# Patient Record
Sex: Male | Born: 1955 | Race: White | Hispanic: No | Smoking: Current every day smoker
Health system: Southern US, Community
[De-identification: ages and names within clinical notes are randomized; demographics above are authoritative.]

## PROBLEM LIST (undated history)

## (undated) DIAGNOSIS — R972 Elevated prostate specific antigen [PSA]: Secondary | ICD-10-CM

## (undated) DIAGNOSIS — E785 Hyperlipidemia, unspecified: Secondary | ICD-10-CM

## (undated) DIAGNOSIS — K219 Gastro-esophageal reflux disease without esophagitis: Secondary | ICD-10-CM

## (undated) DIAGNOSIS — J342 Deviated nasal septum: Secondary | ICD-10-CM

## (undated) DIAGNOSIS — R7301 Impaired fasting glucose: Secondary | ICD-10-CM

## (undated) DIAGNOSIS — F419 Anxiety disorder, unspecified: Secondary | ICD-10-CM

## (undated) DIAGNOSIS — F329 Major depressive disorder, single episode, unspecified: Secondary | ICD-10-CM

## (undated) DIAGNOSIS — G4730 Sleep apnea, unspecified: Secondary | ICD-10-CM

## (undated) DIAGNOSIS — I82409 Acute embolism and thrombosis of unspecified deep veins of unspecified lower extremity: Secondary | ICD-10-CM

## (undated) DIAGNOSIS — N400 Enlarged prostate without lower urinary tract symptoms: Secondary | ICD-10-CM

## (undated) DIAGNOSIS — F32A Depression, unspecified: Secondary | ICD-10-CM

## (undated) DIAGNOSIS — I10 Essential (primary) hypertension: Secondary | ICD-10-CM

## (undated) DIAGNOSIS — N486 Induration penis plastica: Secondary | ICD-10-CM

## (undated) DIAGNOSIS — J309 Allergic rhinitis, unspecified: Secondary | ICD-10-CM

## (undated) DIAGNOSIS — C801 Malignant (primary) neoplasm, unspecified: Secondary | ICD-10-CM

## (undated) HISTORY — PX: TONSILLECTOMY: SUR1361

## (undated) HISTORY — DX: Elevated prostate specific antigen (PSA): R97.20

## (undated) HISTORY — DX: Major depressive disorder, single episode, unspecified: F32.9

## (undated) HISTORY — DX: Essential (primary) hypertension: I10

## (undated) HISTORY — DX: Acute embolism and thrombosis of unspecified deep veins of unspecified lower extremity: I82.409

## (undated) HISTORY — DX: Sleep apnea, unspecified: G47.30

## (undated) HISTORY — DX: Allergic rhinitis, unspecified: J30.9

## (undated) HISTORY — PX: OTHER SURGICAL HISTORY: SHX169

## (undated) HISTORY — DX: Depression, unspecified: F32.A

## (undated) HISTORY — DX: Benign prostatic hyperplasia without lower urinary tract symptoms: N40.0

## (undated) HISTORY — DX: Hyperlipidemia, unspecified: E78.5

## (undated) HISTORY — DX: Anxiety disorder, unspecified: F41.9

## (undated) HISTORY — DX: Gastro-esophageal reflux disease without esophagitis: K21.9

## (undated) HISTORY — DX: Induration penis plastica: N48.6

---

## 2006-10-15 ENCOUNTER — Ambulatory Visit: Payer: Self-pay | Admitting: Family Medicine

## 2008-11-15 ENCOUNTER — Ambulatory Visit: Payer: Self-pay | Admitting: Family Medicine

## 2009-08-23 ENCOUNTER — Ambulatory Visit: Payer: Self-pay | Admitting: Otolaryngology

## 2009-09-04 ENCOUNTER — Ambulatory Visit: Payer: Self-pay | Admitting: Otolaryngology

## 2010-12-10 ENCOUNTER — Ambulatory Visit: Payer: Self-pay | Admitting: Internal Medicine

## 2011-07-18 ENCOUNTER — Ambulatory Visit: Payer: Self-pay | Admitting: Family Medicine

## 2014-08-10 ENCOUNTER — Ambulatory Visit: Payer: Self-pay | Admitting: Family Medicine

## 2014-08-10 DIAGNOSIS — F32A Depression, unspecified: Secondary | ICD-10-CM | POA: Insufficient documentation

## 2014-08-10 DIAGNOSIS — F329 Major depressive disorder, single episode, unspecified: Secondary | ICD-10-CM | POA: Insufficient documentation

## 2014-08-10 DIAGNOSIS — R7303 Prediabetes: Secondary | ICD-10-CM | POA: Insufficient documentation

## 2014-08-10 DIAGNOSIS — G4730 Sleep apnea, unspecified: Secondary | ICD-10-CM | POA: Insufficient documentation

## 2014-08-10 DIAGNOSIS — I82409 Acute embolism and thrombosis of unspecified deep veins of unspecified lower extremity: Secondary | ICD-10-CM | POA: Insufficient documentation

## 2014-08-10 DIAGNOSIS — F419 Anxiety disorder, unspecified: Secondary | ICD-10-CM

## 2014-08-10 DIAGNOSIS — K219 Gastro-esophageal reflux disease without esophagitis: Secondary | ICD-10-CM | POA: Insufficient documentation

## 2015-01-29 ENCOUNTER — Ambulatory Visit: Payer: Self-pay | Admitting: Family Medicine

## 2015-03-22 DIAGNOSIS — J301 Allergic rhinitis due to pollen: Secondary | ICD-10-CM | POA: Insufficient documentation

## 2015-03-22 DIAGNOSIS — E782 Mixed hyperlipidemia: Secondary | ICD-10-CM | POA: Insufficient documentation

## 2015-03-22 DIAGNOSIS — I10 Essential (primary) hypertension: Secondary | ICD-10-CM | POA: Insufficient documentation

## 2015-07-12 ENCOUNTER — Ambulatory Visit: Payer: Self-pay | Admitting: Urology

## 2015-07-19 ENCOUNTER — Other Ambulatory Visit: Payer: Self-pay | Admitting: *Deleted

## 2015-07-19 ENCOUNTER — Encounter: Payer: Self-pay | Admitting: *Deleted

## 2015-07-31 ENCOUNTER — Ambulatory Visit (INDEPENDENT_AMBULATORY_CARE_PROVIDER_SITE_OTHER): Payer: 59 | Admitting: Urology

## 2015-07-31 ENCOUNTER — Encounter: Payer: Self-pay | Admitting: Urology

## 2015-07-31 VITALS — BP 149/90 | HR 73 | Ht 75.0 in | Wt 225.7 lb

## 2015-07-31 DIAGNOSIS — N401 Enlarged prostate with lower urinary tract symptoms: Secondary | ICD-10-CM

## 2015-07-31 DIAGNOSIS — N486 Induration penis plastica: Secondary | ICD-10-CM

## 2015-07-31 DIAGNOSIS — R972 Elevated prostate specific antigen [PSA]: Secondary | ICD-10-CM

## 2015-07-31 DIAGNOSIS — N138 Other obstructive and reflux uropathy: Secondary | ICD-10-CM

## 2015-07-31 LAB — BLADDER SCAN AMB NON-IMAGING: SCAN RESULT: 21

## 2015-07-31 NOTE — Progress Notes (Signed)
07/31/2015 9:16 AM   Derrick Reyes. Mar 16, 1956 [id number]  Referring provider: No referring provider defined for this encounter.  Chief Complaint  Patient presents with  . Elevated PSA    6 month recheck  . Benign Prostatic Hypertrophy    HPI: Mr. Derrick Reyes is a 59 year old white male with elevated PSA, Peyronie's disease and BPH with L UTS who presents today for 6 month follow-up.  Elevated PSA Patient underwent a prostate biopsy on 09/02/2011 for a PSA of 5.8 ng/mL and on 03/22/2013 for a PSA of 5.7 ng/mL and both were negative.  Patient also had a PCA 3 test performed on 11/30/2013 with the results returning at 55, which is a negative result. His most current PSA is 5.9 ng/mL on 01/13/2015.   Peyronie's disease: Patient complains of a mild dorsal curvature and impaired sensation. It is not interfering with excellent activity and he denies painful erections.  BPH WITH LUTS: His IPSS score today is 4, which is mild lower urinary tract symptomatology. He is mostly satisfied with his quality life due to his urinary symptoms. His PVR is 21 mL.    He has no major urinary complaints today.  He denies any dysuria, hematuria or suprapubic pain.   He has had a prostate biopsy on 09/02/2011 which was negative for malignancy.  Previous PSA's:       12/04/2010- 3.04 ng/mL       06/14/2011- 12.29 ng/mL        07/26/2011- 5.8 ng/mL        09/02/2011- 5.8 ng/mL; biopsy negative        12/21/2011- 6.2 ng/mL        02/24/2013- 5.7 ng/mL        03/22/2013-biopsy negative        10/01/2013- 7.6 ng/mL        11/30/2013- PCA3 negative        07/13/2014- 5.3 ng/mL        5.9 ng/mL on 01/13/2015  He also denies any recent fevers, chills, nausea or vomiting.  He does not have a family history of PCa.      IPSS      07/31/15 0800       International Prostate Symptom Score   How often have you had the sensation of not emptying your bladder? Not at All     How often have you  had to urinate less than every two hours? Not at All     How often have you found you stopped and started again several times when you urinated? Not at All     How often have you found it difficult to postpone urination? Not at All     How often have you had a weak urinary stream? About half the time     How often have you had to strain to start urination? Not at All     How many times did you typically get up at night to urinate? 1 Time     Total IPSS Score 4     Quality of Life due to urinary symptoms   If you were to spend the rest of your life with your urinary condition just the way it is now how would you feel about that? Mostly Satisfied        Score:  1-7 Mild 8-19 Moderate 20-35 Severe   PMH: Past Medical History  Diagnosis Date  . Sleep apnea   . HTN (hypertension)   . Anxiety and depression   .  DVT (deep venous thrombosis)   . HLD (hyperlipidemia)   . GERD (gastroesophageal reflux disease)   . Allergic rhinitis   . Elevated PSA   . Peyronie's disease   . BPH (benign prostatic hyperplasia)     Surgical History: Past Surgical History  Procedure Laterality Date  . Tonsillectomy      Home Medications:    Medication List       This list is accurate as of: 07/31/15  9:16 AM.  Always use your most recent med list.               amLODipine 10 MG tablet  Commonly known as:  NORVASC  Take 10 mg by mouth daily.     atorvastatin 10 MG tablet  Commonly known as:  LIPITOR  TAKE 1 TABLET (10 MG TOTAL) BY MOUTH ONCE DAILY.     CENTRUM SPECIALIST HEART Tabs     Cinnamon 500 MG capsule     RA KRILL OIL 500 MG Caps     sertraline 100 MG tablet  Commonly known as:  ZOLOFT  Take 100 mg by mouth daily.     triamcinolone 55 MCG/ACT Aero nasal inhaler  Commonly known as:  NASACORT  Place into the nose.     XARELTO 20 MG Tabs tablet  Generic drug:  rivaroxaban  TAKE 1 TABLET (20 MG TOTAL) BY MOUTH DAILY WITH DINNER.        Allergies:  Allergies    Allergen Reactions  . Lisinopril   . Losartan     Family History: Family History  Problem Relation Age of Onset  . Lung cancer Mother   . Prostate cancer Neg Hx   . Kidney disease Neg Hx     Social History:  reports that he has been smoking.  He does not have any smokeless tobacco history on file. He reports that he drinks alcohol. He reports that he does not use illicit drugs.  ROS: UROLOGY Frequent Urination?: No Hard to postpone urination?: No Burning/pain with urination?: No Get up at night to urinate?: No Leakage of urine?: No Urine stream starts and stops?: No Trouble starting stream?: No Do you have to strain to urinate?: No Blood in urine?: No Urinary tract infection?: No Sexually transmitted disease?: No Injury to kidneys or bladder?: No Painful intercourse?: No Weak stream?: Yes Erection problems?: Yes Penile pain?: No  Gastrointestinal Nausea?: No Vomiting?: No Indigestion/heartburn?: No Diarrhea?: No Constipation?: No  Constitutional Fever: No Night sweats?: No Weight loss?: No Fatigue?: No  Skin Skin rash/lesions?: No Itching?: No  Eyes Blurred vision?: No Double vision?: No  Ears/Nose/Throat Sore throat?: No Sinus problems?: Yes  Hematologic/Lymphatic Swollen glands?: No Easy bruising?: Yes  Cardiovascular Leg swelling?: No Chest pain?: No  Respiratory Cough?: No Shortness of breath?: No  Endocrine Excessive thirst?: No  Musculoskeletal Back pain?: Yes Joint pain?: No  Neurological Headaches?: No Dizziness?: No  Psychologic Depression?: Yes Anxiety?: Yes  Physical Exam: BP 149/90 mmHg  Pulse 73  Ht 6\' 3"  (1.905 m)  Wt 225 lb 11.2 oz (102.377 kg)  BMI 28.21 kg/m2  GU: Patient with circumcised phallus.  Urethral meatus is patent.  No penile discharge. No penile lesions or rashes. Scrotum without lesions, cysts, rashes and/or edema.  Testicles are located scrotally bilaterally. No masses are appreciated in the  testicles. Left and right epididymis are normal. Rectal: Patient with  normal sphincter tone. Perineum without scarring or rashes. No rectal masses are appreciated. Prostate is approximately 60 grams,  no nodules are appreciated. Seminal vesicles are normal.   Laboratory Data: Results for orders placed or performed in visit on 07/31/15  PSA  Result Value Ref Range   Prostate Specific Ag, Serum 7.2 (H) 0.0 - 4.0 ng/mL   Pertinent Imaging: BLADDER SCAN AMB NON-IMAGING  Result Value Ref Range   Scan Result 21     Assessment & Plan:    1. Elevated PSA:   Patient with a history of elevated PSA and 2 negative biopsies and a negative PCA 3. We will continue to monitor every 6 months with DRE's, IPSS scores and PSA's.   If patient should develop an abnormal DRE or a rapid rise in PSA, we will consider re-biopsying.  - PSA - BLADDER SCAN AMB NON-IMAGING  2. Peyronie's disease:   Patient's disease is mild at this time. We will continue to monitor. We will obtain a SHIM score in 6 months.  3. BPH with LUTS:   I PSS score is 4/2. His PVR is 21 mL. We will continue to monitor the patient. He will follow-up in 6 months time for a I PSS score and PVR.  Nursing note for RTC:      - IPSS score      - PVR      -SHIM score      -PSA (should have been drawn prior to appointment)   Return in about 6 months (around 01/31/2016) for IPSS and PVR.  Zara Council, [facility] [address], [place],  [address] [phone]

## 2015-08-01 LAB — PSA: PROSTATE SPECIFIC AG, SERUM: 7.2 ng/mL — AB (ref 0.0–4.0)

## 2015-08-06 DIAGNOSIS — R972 Elevated prostate specific antigen [PSA]: Secondary | ICD-10-CM | POA: Insufficient documentation

## 2015-08-06 DIAGNOSIS — N486 Induration penis plastica: Secondary | ICD-10-CM | POA: Insufficient documentation

## 2015-08-06 DIAGNOSIS — N401 Enlarged prostate with lower urinary tract symptoms: Secondary | ICD-10-CM

## 2015-08-06 DIAGNOSIS — N138 Other obstructive and reflux uropathy: Secondary | ICD-10-CM | POA: Insufficient documentation

## 2016-01-23 ENCOUNTER — Other Ambulatory Visit: Payer: Self-pay

## 2016-01-23 DIAGNOSIS — R972 Elevated prostate specific antigen [PSA]: Secondary | ICD-10-CM

## 2016-01-24 ENCOUNTER — Other Ambulatory Visit: Payer: 59

## 2016-01-24 DIAGNOSIS — R972 Elevated prostate specific antigen [PSA]: Secondary | ICD-10-CM

## 2016-01-25 LAB — PSA: PROSTATE SPECIFIC AG, SERUM: 10.7 ng/mL — AB (ref 0.0–4.0)

## 2016-01-31 ENCOUNTER — Ambulatory Visit (INDEPENDENT_AMBULATORY_CARE_PROVIDER_SITE_OTHER): Payer: 59 | Admitting: Urology

## 2016-01-31 ENCOUNTER — Encounter: Payer: Self-pay | Admitting: Urology

## 2016-01-31 VITALS — BP 153/90 | HR 65 | Ht 75.0 in | Wt 234.9 lb

## 2016-01-31 DIAGNOSIS — N486 Induration penis plastica: Secondary | ICD-10-CM

## 2016-01-31 DIAGNOSIS — N528 Other male erectile dysfunction: Secondary | ICD-10-CM | POA: Diagnosis not present

## 2016-01-31 DIAGNOSIS — N529 Male erectile dysfunction, unspecified: Secondary | ICD-10-CM

## 2016-01-31 DIAGNOSIS — N401 Enlarged prostate with lower urinary tract symptoms: Secondary | ICD-10-CM

## 2016-01-31 DIAGNOSIS — R972 Elevated prostate specific antigen [PSA]: Secondary | ICD-10-CM | POA: Diagnosis not present

## 2016-01-31 DIAGNOSIS — N138 Other obstructive and reflux uropathy: Secondary | ICD-10-CM

## 2016-01-31 MED ORDER — PENTOXIFYLLINE ER 400 MG PO TBCR: EXTENDED_RELEASE_TABLET | ORAL | Status: DC

## 2016-01-31 NOTE — Progress Notes (Signed)
9:19 AM   Derrick Reyes 1956/08/17 [id number]  Referring provider: Sofie Hartigan, MD [place], [place] [address]  Chief Complaint  Patient presents with  . Elevated PSA    6 month follow up     HPI: Mr. Derrick Reyes is a 60 year old white male with elevated PSA, Peyronie's disease and BPH with LUTS who presents today for 6 month follow-up.  Elevated PSA Patient underwent a prostate biopsy on 09/02/2011 for a PSA of 5.8 ng/mL and on 03/22/2013 for a PSA of 5.7 ng/mL and both were negative.  Patient also had a PCA 3 test performed on 11/30/2013 with the results returning at 76, which is a negative result. His most current PSA is 10.7 ng/mL on 01/24/2016.   Peyronie's disease: Patient complains of a mild dorsal curvature and impaired sensation. It is now interfering with sexual activity.  It is not getting firm enough for intercourse.  He denies painful erections.  Erectile dysfunction His SHIM score is 12, which is mild to moderate ED.   He has been having difficulty with erections the several months.   His major complaint is the lack of firmness for successful intercourse.  His libido is preserved.   His risk factors for ED are BPH, smoking, HTN, HLD, Peyronie's disease and sleep apnea.  He denies any painful erections or curvatures with his erections.        SHIM      01/31/16 0840       SHIM: Over the last 6 months:   How do you rate your confidence that you could get and keep an erection? Moderate     When you had erections with sexual stimulation, how often were your erections hard enough for penetration (entering your partner)? A Few Times (much less than half the time)     During sexual intercourse, how often were you able to maintain your erection after you had penetrated (entered) your partner? Very Difficult     During sexual intercourse, how difficult was it to maintain your erection to completion of  intercourse? Difficult     When you attempted sexual intercourse, how often was it satisfactory for you? Very Difficult     SHIM Total Score   SHIM 12        Score: 1-7 Severe ED 8-11 Moderate ED 12-16 Mild-Moderate ED 17-21 Mild ED 22-25 No ED   BPH WITH LUTS: His IPSS score today is 3, which is mild lower urinary tract symptomatology. He is mostly satisfied with his quality life due to his urinary symptoms.  He has no major urinary complaints today.  He denies any dysuria, hematuria or suprapubic pain.   He has had a prostate biopsy on 09/02/2011 which was negative for malignancy.  He also denies any recent fevers, chills, nausea or vomiting.  He does not have a family history of PCa.      IPSS      01/31/16 0800       International Prostate Symptom Score   How often have you had the sensation of not emptying your bladder? Not at All     How often have you had to urinate less than every two hours? Not at All     How often have you found you stopped and started again several times when you urinated? Less than 1 in 5 times     How often have you found it difficult to postpone  urination? Not at All     How often have you had a weak urinary stream? Less than 1 in 5 times     How often have you had to strain to start urination? Not at All     How many times did you typically get up at night to urinate? 1 Time     Total IPSS Score 3     Quality of Life due to urinary symptoms   If you were to spend the rest of your life with your urinary condition just the way it is now how would you feel about that? Mostly Satisfied        Score:  1-7 Mild 8-19 Moderate 20-35 Severe   PMH: Past Medical History  Diagnosis Date  . Sleep apnea   . HTN (hypertension)   . Anxiety and depression   . DVT (deep venous thrombosis) ([place])   . HLD (hyperlipidemia)   . GERD (gastroesophageal reflux disease)   . Allergic rhinitis   . Elevated PSA   . Peyronie's disease   . BPH (benign prostatic  hyperplasia)   . HLD (hyperlipidemia)     Surgical History: Past Surgical History  Procedure Laterality Date  . Tonsillectomy      Home Medications:    Medication List       This list is accurate as of: 01/31/16  9:19 AM.  Always use your most recent med list.               amLODipine 10 MG tablet  Commonly known as:  NORVASC  Take 10 mg by mouth daily.     atorvastatin 10 MG tablet  Commonly known as:  LIPITOR  TAKE 1 TABLET (10 MG TOTAL) BY MOUTH ONCE DAILY.     CENTRUM SPECIALIST HEART Tabs  Reported on 01/31/2016     Cinnamon 500 MG capsule     pentoxifylline 400 MG CR tablet  Commonly known as:  TRENTAL  Take twice daily     RA KRILL OIL 500 MG Caps     sertraline 100 MG tablet  Commonly known as:  ZOLOFT  Take 100 mg by mouth daily.     triamcinolone 55 MCG/ACT Aero nasal inhaler  Commonly known as:  NASACORT  Place into the nose.     XARELTO 20 MG Tabs tablet  Generic drug:  rivaroxaban  TAKE 1 TABLET (20 MG TOTAL) BY MOUTH DAILY WITH DINNER.        Allergies:  Allergies  Allergen Reactions  . Lisinopril   . Losartan     Family History: Family History  Problem Relation Age of Onset  . Lung cancer Mother   . Prostate cancer Neg Hx   . Kidney disease Neg Hx     Social History:  reports that he has been smoking.  He does not have any smokeless tobacco history on file. He reports that he drinks alcohol. He reports that he does not use illicit drugs.  ROS: UROLOGY Frequent Urination?: No Hard to postpone urination?: No Burning/pain with urination?: No Get up at night to urinate?: No Leakage of urine?: No Urine stream starts and stops?: No Trouble starting stream?: No Do you have to strain to urinate?: No Blood in urine?: No Urinary tract infection?: No Sexually transmitted disease?: No Injury to kidneys or bladder?: No Painful intercourse?: No Weak stream?: No Erection problems?: Yes Penile pain?:  No  Gastrointestinal Nausea?: No Vomiting?: No Indigestion/heartburn?: No Diarrhea?: No Constipation?: No  Constitutional  Fever: No Night sweats?: No Weight loss?: No Fatigue?: No  Skin Skin rash/lesions?: No Itching?: No  Eyes Blurred vision?: No Double vision?: No  Ears/Nose/Throat Sore throat?: No Sinus problems?: No  Hematologic/Lymphatic Swollen glands?: No Easy bruising?: No  Cardiovascular Leg swelling?: No Chest pain?: No  Respiratory Cough?: No Shortness of breath?: No  Endocrine Excessive thirst?: No  Musculoskeletal Back pain?: No Joint pain?: No  Neurological Headaches?: No Dizziness?: No  Psychologic Depression?: No Anxiety?: No  Physical Exam: BP 153/90 mmHg  Pulse 65  Ht 6\' 3"  (1.905 m)  Wt 234 lb 14.4 oz (106.55 kg)  BMI 29.36 kg/m2  GU: Patient with circumcised phallus.  Urethral meatus is patent.  No penile discharge. No penile lesions or rashes. Scrotum without lesions, cysts, rashes and/or edema.  Testicles are located scrotally bilaterally. No masses are appreciated in the testicles. Left and right epididymis are normal. Rectal: Patient with  normal sphincter tone. Perineum without scarring or rashes. No rectal masses are appreciated. Prostate is approximately 60 grams, no nodules are appreciated. Seminal vesicles are normal.   Laboratory Data: Previous PSA's:       12/04/2010- 3.04 ng/mL       06/14/2011- 12.29 ng/mL        07/26/2011- 5.8 ng/mL        09/02/2011- 5.8 ng/mL; biopsy negative        12/21/2011- 6.2 ng/mL        02/24/2013- 5.7 ng/mL        03/22/2013-biopsy negative        10/01/2013- 7.6 ng/mL        11/30/2013- PCA3 negative        07/13/2014- 5.3 ng/mL        5.9 ng/mL on 01/13/2015 Results for orders placed or performed in visit on 07/31/15  PSA  Result Value Ref Range   Prostate Specific Ag, Serum 7.2 (H) 0.0 - 4.0 ng/mL   10.7 ng/mL  On 01/23/2015  Assessment & Plan:    1. Elevated PSA:    Patient with a history of elevated PSA and 2 negative biopsies and a negative PCA 3.  His most recent PSA was 10.7 from 01/24/2016.  I have repeated his PSA today.  If it returns elevated, I will obtain an MRI.  Patient is vehemently against a biopsy.  If it returns to baseline, we will continue to monitor and he will RTC in 6 months for IPSS score, exam and PSA.    - PSA  2. Peyronie's disease:   Patient's disease has worsened at this time.  I will start Trental 400 mg bid.  He will return in 6 months for a SHIM score and exam.  3. BPH with LUTS:   IPSS score is 3/2.  His repeated PSA is still pending.  Follow up will be based on those results.    4. Erectile dysfunction:   SHIM score is 12.  We discussed trying a different PDE5 inhibitor, intra-urethral suppositories, intra cavernous vasoactive drug injection therapy, vacuum constriction device and penile prosthesis implantation.  He would like to try Viagra.  He is advised to take it two hours on a empty stomach before intercourse.  He is warned not to take the Viagra with medications that contain nitrates.  I also advised him of the side effects, such as: headache, flushing, dyspepsia, abnormal vision, nasal congestion, back pain, myalgia, nausea, dizziness, and rash.  Return in about 6 months (around 07/30/2016) for IPSS score, SHIM score and exam.  Zara Council, [facility] [address] [place], [place] [address] [phone]

## 2016-02-01 ENCOUNTER — Other Ambulatory Visit: Payer: Self-pay | Admitting: Urology

## 2016-02-01 ENCOUNTER — Telehealth: Payer: Self-pay

## 2016-02-01 DIAGNOSIS — R972 Elevated prostate specific antigen [PSA]: Secondary | ICD-10-CM

## 2016-02-01 LAB — PSA: PROSTATE SPECIFIC AG, SERUM: 9.2 ng/mL — AB (ref 0.0–4.0)

## 2016-02-01 NOTE — Telephone Encounter (Signed)
It's already schd for 02-23-16 at [facility]

## 2016-02-01 NOTE — Telephone Encounter (Signed)
-----   Message from Nori Riis, PA-C sent at 02/01/2016  8:51 AM EST ----- Patient does not want to have another biopsy. He will agree to a MRI of the prostate. This needs to be scheduled at [facility]. I will put in the order

## 2016-02-22 ENCOUNTER — Other Ambulatory Visit: Payer: Self-pay

## 2016-02-22 ENCOUNTER — Telehealth: Payer: Self-pay | Admitting: Urology

## 2016-02-22 NOTE — Telephone Encounter (Signed)
Pt called and would like to get some type of sedative before MRI tomorrow.  He has to be at [facility] at [facility].  L[place] ordered MRI.  [phone] office Pt said it was O.K. To leave message on office line or cell#.  Please advise.

## 2016-02-23 ENCOUNTER — Ambulatory Visit (HOSPITAL_COMMUNITY)
Admission: RE | Admit: 2016-02-23 | Discharge: 2016-02-23 | Disposition: A | Discharge status: Home / Self Care | Payer: 59 | Source: Ambulatory Visit | Attending: Urology | Admitting: Urology

## 2016-02-23 DIAGNOSIS — R938 Abnormal findings on diagnostic imaging of other specified body structures: Secondary | ICD-10-CM | POA: Diagnosis not present

## 2016-02-23 DIAGNOSIS — R972 Elevated prostate specific antigen [PSA]: Secondary | ICD-10-CM | POA: Insufficient documentation

## 2016-02-23 LAB — POCT I-STAT CREATININE: CREATININE: 0.9 mg/dL (ref 0.61–1.24)

## 2016-02-23 MED ORDER — GADOBENATE DIMEGLUMINE 529 MG/ML IV SOLN
20.0000 mL | Freq: Once | INTRAVENOUS | Status: AC | PRN
  Administered 2016-02-23: 20 mL via INTRAVENOUS

## 2016-02-23 NOTE — Telephone Encounter (Signed)
Derrick Reyes was able to handle the situation.

## 2016-02-26 ENCOUNTER — Other Ambulatory Visit: Payer: Self-pay | Admitting: Urology

## 2016-02-26 MED ORDER — DIAZEPAM 10 MG PO TABS: ORAL_TABLET | ORAL | Status: DC

## 2016-03-14 ENCOUNTER — Ambulatory Visit (INDEPENDENT_AMBULATORY_CARE_PROVIDER_SITE_OTHER): Payer: 59 | Admitting: Urology

## 2016-03-14 ENCOUNTER — Encounter: Payer: Self-pay | Admitting: Urology

## 2016-03-14 VITALS — BP 131/84 | HR 72 | Ht 75.0 in | Wt 231.2 lb

## 2016-03-14 DIAGNOSIS — N529 Male erectile dysfunction, unspecified: Secondary | ICD-10-CM | POA: Diagnosis not present

## 2016-03-14 DIAGNOSIS — N486 Induration penis plastica: Secondary | ICD-10-CM

## 2016-03-14 DIAGNOSIS — N400 Enlarged prostate without lower urinary tract symptoms: Secondary | ICD-10-CM | POA: Diagnosis not present

## 2016-03-14 DIAGNOSIS — R972 Elevated prostate specific antigen [PSA]: Secondary | ICD-10-CM

## 2016-03-14 MED ORDER — LEVOFLOXACIN 500 MG PO TABS
500.0000 mg | ORAL_TABLET | Freq: Once | ORAL | Status: AC
Start: 2016-03-14 — End: 2016-03-14
  Administered 2016-03-14: 500 mg via ORAL

## 2016-03-14 MED ORDER — GENTAMICIN SULFATE 40 MG/ML IJ SOLN
80.0000 mg | Freq: Once | INTRAMUSCULAR | Status: AC
Start: 2016-03-14 — End: 2016-03-14
  Administered 2016-03-14: 80 mg via INTRAMUSCULAR

## 2016-03-14 NOTE — Progress Notes (Signed)
03/14/2016 2:08 PM   Derrick Reyes 01-24-1956 [id number]  Referring provider: Sofie Hartigan, MD [place],  [address]  Chief Complaint  Patient presents with  . Biopsy    prostate biopsy    HPI: Mr. Derrick Reyes is a 60 year old white male with elevated PSA, Peyronie's disease and BPH with LUTS who presents today for discussion of MRI results  Elevated PSA Patient underwent a prostate biopsy on 09/02/2011 for a PSA of 5.8 ng/mL and on 03/22/2013 for a PSA of 5.7 ng/mL and both were negative. Patient also had a PCA 3 test performed on 11/30/2013 with the results returning at 25, which is a negative result. His most current PSA is 10.7 ng/mL on 01/24/2016.  He underwent a diffusion-weighted MRI of prostate which showed 2 PIRADS-4 lesions. He had a 1-1/2 cm lesion in the left lateral mid gland. He also had a 1.7 cm lesion in the anterior aspect of the right mid gland near the transition zone.  Peyronie's disease: Patient complains of a mild dorsal curvature and impaired sensation. It is now interfering with sexual activity. It is not getting firm enough for intercourse. He denies painful erections.  Erectile dysfunction His SHIM score is 12, which is mild to moderate ED. He has been having difficulty with erections the several months. His major complaint is the lack of firmness for successful intercourse. His libido is preserved. His risk factors for ED are BPH, smoking, HTN, HLD, Peyronie's disease and sleep apnea. He denies any painful erections or curvatures with his erections.     PMH: Past Medical History  Diagnosis Date  . Sleep apnea   . HTN (hypertension)   . Anxiety and depression   . DVT (deep venous thrombosis) ([place])   . HLD (hyperlipidemia)   . GERD (gastroesophageal reflux disease)   . Allergic rhinitis   . Elevated PSA   . Peyronie's disease   . BPH (benign prostatic hyperplasia)   .  HLD (hyperlipidemia)     Surgical History: Past Surgical History  Procedure Laterality Date  . Tonsillectomy      Home Medications:    Medication List       This list is accurate as of: 03/14/16  2:08 PM.  Always use your most recent med list.               amLODipine 10 MG tablet  Commonly known as:  NORVASC  Take 10 mg by mouth daily.     atorvastatin 10 MG tablet  Commonly known as:  LIPITOR  TAKE 1 TABLET (10 MG TOTAL) BY MOUTH ONCE DAILY.     Cinnamon 500 MG capsule     diazepam 10 MG tablet  Commonly known as:  VALIUM  Take one tablet 30 minutes prior to your biopsy.     pentoxifylline 400 MG CR tablet  Commonly known as:  TRENTAL  Take twice daily     RA KRILL OIL 500 MG Caps     sertraline 100 MG tablet  Commonly known as:  ZOLOFT  Take 100 mg by mouth daily.     triamcinolone 55 MCG/ACT Aero nasal inhaler  Commonly known as:  NASACORT  Place into the nose.     XARELTO 20 MG Tabs tablet  Generic drug:  rivaroxaban  TAKE 1 TABLET (20 MG TOTAL) BY MOUTH DAILY WITH DINNER.        Allergies:  Allergies  Allergen Reactions  .  Lisinopril   . Losartan     Family History: Family History  Problem Relation Age of Onset  . Lung cancer Mother   . Prostate cancer Neg Hx   . Kidney disease Neg Hx     Social History:  reports that he has been smoking Cigarettes.  He has been smoking about 1.00 pack per day. He does not have any smokeless tobacco history on file. He reports that he drinks alcohol. He reports that he does not use illicit drugs.  ROS:                                        Physical Exam: BP 131/84 mmHg  Pulse 72  Ht 6\' 3"  (1.905 m)  Wt 231 lb 3.2 oz (104.872 kg)  BMI 28.90 kg/m2  Constitutional:  Alert and oriented, No acute distress. HEENT: [place] AT, moist mucus membranes.  Trachea midline, no masses. Cardiovascular: No clubbing, cyanosis, or edema. Respiratory: Normal respiratory effort, no increased work  of breathing. GI: Abdomen is soft, nontender, nondistended, no abdominal masses GU: No CVA tenderness.  Skin: No rashes, bruises or suspicious lesions. Lymph: No cervical or inguinal adenopathy. Neurologic: Grossly intact, no focal deficits, moving all 4 extremities. Psychiatric: Normal mood and affect.  Laboratory Data: No results found for: WBC, HGB, HCT, MCV, PLT  Lab Results  Component Value Date   CREATININE 0.90 02/23/2016    No results found for: PSA  No results found for: TESTOSTERONE  No results found for: HGBA1C  Urinalysis No results found for: COLORURINE, APPEARANCEUR, LABSPEC, PHURINE, GLUCOSEU, HGBUR, BILIRUBINUR, KETONESUR, PROTEINUR, UROBILINOGEN, NITRITE, LEUKOCYTESUR  Pertinent Imaging: CLINICAL DATA: Elevated PSA. Prostate Biopsy from 2012 is negative by report.  EXAM: MR PROSTATE WITHOUT AND WITH CONTRAST  TECHNIQUE: Multiplanar multisequence MRI images were obtained of the pelvis centered about the prostate. Pre and post contrast images were obtained.  CONTRAST: 39mL MULTIHANCE GADOBENATE DIMEGLUMINE 529 MG/ML IV SOLN  COMPARISON: None.  FINDINGS: Prostate: On T2 weighted imaging is heterogeneous signal intensity within the peripheral zone. One region of decreased signal intensity in the LEFT lateral mid gland.  On diffusion-weighted imaging there is a region of restricted diffusion within LEFT lateral mid gland 14 mm x 9 mm (image 13, series 201). Additional region of apparent restricted diffusion within the anterior aspect of the RIGHT mid gland along the junction with the transitional zone measures 17 mm x 12 mm on image 16, series 1200.  Postcontrast imaging demonstrates extensive enhance within the peripheral zone without focality.  Transcapsular spread: Absent  Seminal vesicle involvement: Absent  Neurovascular bundle involvement: Absent  Pelvic adenopathy: Absent  Bone metastasis: Absent  Other findings:  Prostate volume is 51 by 31 cm in axial dimension and 42 cm in craniocaudad dimension. Relatively small transitional zone with minimal nodularity.  IMPRESSION: 1. 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 One within the LEFT lateral mid gland measuring 1.4 cm. A second in the anterior RIGHT mid gland measuring 1.6 cm along the junction of the transitional zone. 2. No extracapsular extension. Normal seminal vesicles. No lymphadenopathy.   Assessment & Plan:    1. Elevated PSA: Patient with a history of elevated PSA and 2 negative biopsies and a negative PCA 3. His most recent PSA was 10.7 from 01/24/2016. Diffusion-weighted MRI of the prostate shows two PIRADS 4 lesions. I discussed with the patient that the best way  to sample the tissue of these high-risk lesions would be MRI fusion prostate biopsy. Unfortunately, this office does not currently have that technology. I'll refer him to [facility] to undergo a MRI fusion prostate biopsy.  - referral to [facility] for MRI fusion prostate biopsy  2. Peyronie's disease:Defer further work up until after above  3. BPH with LUTS: IPSS score is 3/2. No treatment necessary  4. Erectile dysfunction: Continue viagra   Nickie Retort, [place] [address] [place], [place] [address] [phone]

## 2016-03-21 ENCOUNTER — Ambulatory Visit: Payer: 59

## 2016-05-24 ENCOUNTER — Ambulatory Visit
Admission: EM | Admit: 2016-05-24 | Discharge: 2016-05-24 | Disposition: A | Discharge status: Home / Self Care | Payer: 59 | Attending: Family Medicine | Admitting: Family Medicine

## 2016-05-24 DIAGNOSIS — L03113 Cellulitis of right upper limb: Secondary | ICD-10-CM | POA: Diagnosis not present

## 2016-05-24 MED ORDER — SULFAMETHOXAZOLE-TRIMETHOPRIM 800-160 MG PO TABS: 1.0000 | ORAL_TABLET | Freq: Two times a day (BID) | ORAL | Status: DC

## 2016-05-24 NOTE — Discharge Instructions (Signed)

## 2016-05-24 NOTE — ED Notes (Signed)
Pt states he has a MRI with IV contrast 5/31, states he was not really feeling well last week and on Monday he noticed tenderness around the IV site, and today having redness and swelling. Denies injury

## 2016-05-24 NOTE — ED Provider Notes (Signed)
CSN: [id number]     Arrival date & time 05/24/16  1617 History   First MD Initiated Contact with Patient 05/24/16 1707     Chief Complaint  Patient presents with  . Cellulitis   (Consider location/radiation/quality/duration/timing/severity/associated sxs/prior Treatment) HPI Comments: 60 yo male with a 3 days h/o progressively worsening redness, tenderness and warmth to skin on antecubital area and forearm. Denies any fevers, chills, drainage. States 2 days prior he had IV placed for contrast for an MRI procedure.   The history is provided by the patient.    Past Medical History  Diagnosis Date  . Sleep apnea   . HTN (hypertension)   . Anxiety and depression   . DVT (deep venous thrombosis) ([place])   . HLD (hyperlipidemia)   . GERD (gastroesophageal reflux disease)   . Allergic rhinitis   . Elevated PSA   . Peyronie's disease   . BPH (benign prostatic hyperplasia)   . HLD (hyperlipidemia)    Past Surgical History  Procedure Laterality Date  . Tonsillectomy     Family History  Problem Relation Age of Onset  . Lung cancer Mother   . Prostate cancer Neg Hx   . Kidney disease Neg Hx    Social History  Substance Use Topics  . Smoking status: Current Every Day Smoker -- 1.00 packs/day    Types: Cigarettes  . Smokeless tobacco: None  . Alcohol Use: 0.0 oz/week    0 Standard drinks or equivalent per week     Comment: moderate    Review of Systems  Allergies  Lisinopril; Losartan; and Codeine  Home Medications   Prior to Admission medications   Medication Sig Start Date End Date Taking? Authorizing Provider  amLODipine (NORVASC) 10 MG tablet Take 10 mg by mouth daily. 06/02/15  Yes Historical Provider, MD  atorvastatin (LIPITOR) 10 MG tablet TAKE 1 TABLET (10 MG TOTAL) BY MOUTH ONCE DAILY. 06/29/15  Yes Historical Provider, MD  Cinnamon 500 MG capsule    Yes Historical Provider, MD  pentoxifylline (TRENTAL) 400 MG CR tablet Take twice daily 01/31/16  Yes Shannon A McGowan,  PA-C  RA KRILL OIL 500 MG CAPS    Yes Historical Provider, MD  sertraline (ZOLOFT) 100 MG tablet Take 100 mg by mouth daily. 06/02/15  Yes Historical Provider, MD  triamcinolone (NASACORT) 55 MCG/ACT AERO nasal inhaler Place into the nose.   Yes Historical Provider, MD  XARELTO 20 MG TABS tablet TAKE 1 TABLET (20 MG TOTAL) BY MOUTH DAILY WITH DINNER. 05/08/15  Yes Historical Provider, MD  diazepam (VALIUM) 10 MG tablet Take one tablet 30 minutes prior to your biopsy. 02/26/16   Larene Beach A McGowan, PA-C  sulfamethoxazole-trimethoprim (BACTRIM DS,SEPTRA DS) 800-160 MG tablet Take 1 tablet by mouth 2 (two) times daily. 05/24/16   Norval Gable, MD   Meds Ordered and Administered this Visit  Medications - No data to display  BP 142/90 mmHg  Pulse 77  Temp(Src) 98.1 F (36.7 C) (Oral)  Resp 16  Ht 6\' 3"  (1.905 m)  Wt 228 lb (103.42 kg)  BMI 28.50 kg/m2  SpO2 97% No data found.   Physical Exam  Constitutional: He appears well-developed and well-nourished. No distress.  Musculoskeletal:       Right elbow: He exhibits swelling. He exhibits normal range of motion, no effusion, no deformity and no laceration.  Right upper extremity neurovascularly intact; normal range of motion; 17x12cm skin area of blanchable erythema, warmth and tenderness to palpation over the antecubital  area extending down the distal forearm  Skin: He is not diaphoretic.  Nursing note and vitals reviewed.   ED Course  Procedures (including critical care time)  Labs Review Labs Reviewed - No data to display  Imaging Review No results found.   Visual Acuity Review  Right Eye Distance:   Left Eye Distance:   Bilateral Distance:    Right Eye Near:   Left Eye Near:    Bilateral Near:         MDM   1. Cellulitis of arm, right    Discharge Medication List as of 05/24/2016  5:15 PM    START taking these medications   Details  sulfamethoxazole-trimethoprim (BACTRIM DS,SEPTRA DS) 800-160 MG tablet Take 1  tablet by mouth 2 (two) times daily., Starting 05/24/2016, Until Discontinued, Normal       1. diagnosis reviewed with patient 2. rx as per orders above; reviewed possible side effects, interactions, risks and benefits  3. Recommend supportive treatment with elevation and warm compresses 4. Follow-up prn if symptoms worsen or don't improve    Norval Gable, MD 05/24/16 1844

## 2016-07-23 ENCOUNTER — Other Ambulatory Visit: Payer: 59

## 2016-07-30 ENCOUNTER — Ambulatory Visit: Payer: 59 | Admitting: Urology

## 2016-12-23 DIAGNOSIS — G4733 Obstructive sleep apnea (adult) (pediatric): Secondary | ICD-10-CM | POA: Diagnosis not present

## 2017-01-23 DIAGNOSIS — G4733 Obstructive sleep apnea (adult) (pediatric): Secondary | ICD-10-CM | POA: Diagnosis not present

## 2017-01-24 DIAGNOSIS — C61 Malignant neoplasm of prostate: Secondary | ICD-10-CM | POA: Diagnosis not present

## 2017-02-20 DIAGNOSIS — G4733 Obstructive sleep apnea (adult) (pediatric): Secondary | ICD-10-CM | POA: Diagnosis not present

## 2017-02-24 DIAGNOSIS — G4733 Obstructive sleep apnea (adult) (pediatric): Secondary | ICD-10-CM | POA: Diagnosis not present

## 2017-03-23 DIAGNOSIS — G4733 Obstructive sleep apnea (adult) (pediatric): Secondary | ICD-10-CM | POA: Diagnosis not present

## 2017-04-22 DIAGNOSIS — G4733 Obstructive sleep apnea (adult) (pediatric): Secondary | ICD-10-CM | POA: Diagnosis not present

## 2017-04-30 DIAGNOSIS — C61 Malignant neoplasm of prostate: Secondary | ICD-10-CM | POA: Diagnosis not present

## 2017-05-23 DIAGNOSIS — G4733 Obstructive sleep apnea (adult) (pediatric): Secondary | ICD-10-CM | POA: Diagnosis not present

## 2017-06-13 DIAGNOSIS — E782 Mixed hyperlipidemia: Secondary | ICD-10-CM | POA: Diagnosis not present

## 2017-06-13 DIAGNOSIS — R7302 Impaired glucose tolerance (oral): Secondary | ICD-10-CM | POA: Diagnosis not present

## 2017-06-13 DIAGNOSIS — I10 Essential (primary) hypertension: Secondary | ICD-10-CM | POA: Diagnosis not present

## 2017-07-28 DIAGNOSIS — G4733 Obstructive sleep apnea (adult) (pediatric): Secondary | ICD-10-CM | POA: Diagnosis not present

## 2017-07-30 DIAGNOSIS — C61 Malignant neoplasm of prostate: Secondary | ICD-10-CM | POA: Diagnosis not present

## 2017-10-24 DIAGNOSIS — I10 Essential (primary) hypertension: Secondary | ICD-10-CM | POA: Diagnosis not present

## 2017-10-24 DIAGNOSIS — E782 Mixed hyperlipidemia: Secondary | ICD-10-CM | POA: Diagnosis not present

## 2017-10-24 DIAGNOSIS — R7302 Impaired glucose tolerance (oral): Secondary | ICD-10-CM | POA: Diagnosis not present

## 2017-10-28 DIAGNOSIS — G4733 Obstructive sleep apnea (adult) (pediatric): Secondary | ICD-10-CM | POA: Diagnosis not present

## 2017-10-31 DIAGNOSIS — R7302 Impaired glucose tolerance (oral): Secondary | ICD-10-CM | POA: Diagnosis not present

## 2017-10-31 DIAGNOSIS — E782 Mixed hyperlipidemia: Secondary | ICD-10-CM | POA: Diagnosis not present

## 2018-01-28 DIAGNOSIS — G4733 Obstructive sleep apnea (adult) (pediatric): Secondary | ICD-10-CM | POA: Diagnosis not present

## 2018-02-06 DIAGNOSIS — C61 Malignant neoplasm of prostate: Secondary | ICD-10-CM | POA: Diagnosis not present

## 2018-04-27 DIAGNOSIS — R7302 Impaired glucose tolerance (oral): Secondary | ICD-10-CM | POA: Diagnosis not present

## 2018-04-27 DIAGNOSIS — E782 Mixed hyperlipidemia: Secondary | ICD-10-CM | POA: Diagnosis not present

## 2018-04-27 DIAGNOSIS — I10 Essential (primary) hypertension: Secondary | ICD-10-CM | POA: Diagnosis not present

## 2018-04-27 DIAGNOSIS — G4733 Obstructive sleep apnea (adult) (pediatric): Secondary | ICD-10-CM | POA: Diagnosis not present

## 2018-08-07 DIAGNOSIS — C61 Malignant neoplasm of prostate: Secondary | ICD-10-CM | POA: Diagnosis not present

## 2018-11-10 DIAGNOSIS — R7302 Impaired glucose tolerance (oral): Secondary | ICD-10-CM | POA: Diagnosis not present

## 2018-11-10 DIAGNOSIS — I10 Essential (primary) hypertension: Secondary | ICD-10-CM | POA: Diagnosis not present

## 2018-11-10 DIAGNOSIS — E782 Mixed hyperlipidemia: Secondary | ICD-10-CM | POA: Diagnosis not present

## 2018-11-10 DIAGNOSIS — Z23 Encounter for immunization: Secondary | ICD-10-CM | POA: Diagnosis not present

## 2018-12-03 DIAGNOSIS — Z1211 Encounter for screening for malignant neoplasm of colon: Secondary | ICD-10-CM | POA: Diagnosis not present

## 2019-02-12 DIAGNOSIS — C61 Malignant neoplasm of prostate: Secondary | ICD-10-CM | POA: Diagnosis not present

## 2019-02-12 DIAGNOSIS — Z8546 Personal history of malignant neoplasm of prostate: Secondary | ICD-10-CM | POA: Diagnosis not present

## 2019-07-29 ENCOUNTER — Encounter: Payer: Self-pay | Admitting: Emergency Medicine

## 2019-07-29 ENCOUNTER — Other Ambulatory Visit: Payer: Self-pay

## 2019-07-29 ENCOUNTER — Inpatient Hospital Stay (HOSPITAL_COMMUNITY): Payer: Medicare Other

## 2019-07-29 ENCOUNTER — Emergency Department
Admission: EM | Admit: 2019-07-29 | Discharge: 2019-07-29 | Disposition: A | Discharge status: Other Acute Inpatient Hospital | Payer: Medicare Other | Attending: Student | Admitting: Student

## 2019-07-29 ENCOUNTER — Inpatient Hospital Stay (HOSPITAL_COMMUNITY)
Admission: AD | Admit: 2019-07-29 | Discharge: 2019-08-02 | DRG: 175 | Disposition: A | Discharge status: Home / Self Care | Payer: Medicare Other | Source: Other Acute Inpatient Hospital | Attending: Internal Medicine | Admitting: Internal Medicine

## 2019-07-29 ENCOUNTER — Emergency Department: Payer: Medicare Other

## 2019-07-29 DIAGNOSIS — F419 Anxiety disorder, unspecified: Secondary | ICD-10-CM | POA: Diagnosis present

## 2019-07-29 DIAGNOSIS — R0902 Hypoxemia: Secondary | ICD-10-CM

## 2019-07-29 DIAGNOSIS — R739 Hyperglycemia, unspecified: Secondary | ICD-10-CM | POA: Diagnosis present

## 2019-07-29 DIAGNOSIS — I2699 Other pulmonary embolism without acute cor pulmonale: Secondary | ICD-10-CM | POA: Diagnosis not present

## 2019-07-29 DIAGNOSIS — Z7901 Long term (current) use of anticoagulants: Secondary | ICD-10-CM | POA: Insufficient documentation

## 2019-07-29 DIAGNOSIS — Z7289 Other problems related to lifestyle: Secondary | ICD-10-CM | POA: Diagnosis not present

## 2019-07-29 DIAGNOSIS — I2602 Saddle embolus of pulmonary artery with acute cor pulmonale: Principal | ICD-10-CM | POA: Diagnosis present

## 2019-07-29 DIAGNOSIS — N401 Enlarged prostate with lower urinary tract symptoms: Secondary | ICD-10-CM | POA: Diagnosis present

## 2019-07-29 DIAGNOSIS — Z885 Allergy status to narcotic agent status: Secondary | ICD-10-CM

## 2019-07-29 DIAGNOSIS — F1010 Alcohol abuse, uncomplicated: Secondary | ICD-10-CM | POA: Diagnosis present

## 2019-07-29 DIAGNOSIS — Z6826 Body mass index (BMI) 26.0-26.9, adult: Secondary | ICD-10-CM | POA: Diagnosis not present

## 2019-07-29 DIAGNOSIS — Z79899 Other long term (current) drug therapy: Secondary | ICD-10-CM | POA: Diagnosis not present

## 2019-07-29 DIAGNOSIS — M1990 Unspecified osteoarthritis, unspecified site: Secondary | ICD-10-CM | POA: Diagnosis present

## 2019-07-29 DIAGNOSIS — G8929 Other chronic pain: Secondary | ICD-10-CM | POA: Diagnosis present

## 2019-07-29 DIAGNOSIS — D696 Thrombocytopenia, unspecified: Secondary | ICD-10-CM | POA: Diagnosis present

## 2019-07-29 DIAGNOSIS — E663 Overweight: Secondary | ICD-10-CM | POA: Diagnosis present

## 2019-07-29 DIAGNOSIS — R000 Tachycardia, unspecified: Secondary | ICD-10-CM | POA: Diagnosis present

## 2019-07-29 DIAGNOSIS — E876 Hypokalemia: Secondary | ICD-10-CM | POA: Diagnosis present

## 2019-07-29 DIAGNOSIS — N138 Other obstructive and reflux uropathy: Secondary | ICD-10-CM | POA: Diagnosis present

## 2019-07-29 DIAGNOSIS — R0602 Shortness of breath: Secondary | ICD-10-CM

## 2019-07-29 DIAGNOSIS — F329 Major depressive disorder, single episode, unspecified: Secondary | ICD-10-CM | POA: Diagnosis present

## 2019-07-29 DIAGNOSIS — Z20828 Contact with and (suspected) exposure to other viral communicable diseases: Secondary | ICD-10-CM | POA: Insufficient documentation

## 2019-07-29 DIAGNOSIS — Z789 Other specified health status: Secondary | ICD-10-CM

## 2019-07-29 DIAGNOSIS — N486 Induration penis plastica: Secondary | ICD-10-CM | POA: Diagnosis present

## 2019-07-29 DIAGNOSIS — Z8546 Personal history of malignant neoplasm of prostate: Secondary | ICD-10-CM

## 2019-07-29 DIAGNOSIS — I10 Essential (primary) hypertension: Secondary | ICD-10-CM | POA: Diagnosis present

## 2019-07-29 DIAGNOSIS — K219 Gastro-esophageal reflux disease without esophagitis: Secondary | ICD-10-CM | POA: Diagnosis present

## 2019-07-29 DIAGNOSIS — Z9114 Patient's other noncompliance with medication regimen: Secondary | ICD-10-CM

## 2019-07-29 DIAGNOSIS — E785 Hyperlipidemia, unspecified: Secondary | ICD-10-CM | POA: Diagnosis present

## 2019-07-29 DIAGNOSIS — J439 Emphysema, unspecified: Secondary | ICD-10-CM | POA: Diagnosis present

## 2019-07-29 DIAGNOSIS — Z9981 Dependence on supplemental oxygen: Secondary | ICD-10-CM

## 2019-07-29 DIAGNOSIS — Z86718 Personal history of other venous thrombosis and embolism: Secondary | ICD-10-CM

## 2019-07-29 DIAGNOSIS — Z0189 Encounter for other specified special examinations: Secondary | ICD-10-CM

## 2019-07-29 DIAGNOSIS — R972 Elevated prostate specific antigen [PSA]: Secondary | ICD-10-CM | POA: Diagnosis present

## 2019-07-29 DIAGNOSIS — F1090 Alcohol use, unspecified, uncomplicated: Secondary | ICD-10-CM

## 2019-07-29 DIAGNOSIS — I2692 Saddle embolus of pulmonary artery without acute cor pulmonale: Secondary | ICD-10-CM

## 2019-07-29 DIAGNOSIS — Z801 Family history of malignant neoplasm of trachea, bronchus and lung: Secondary | ICD-10-CM | POA: Diagnosis not present

## 2019-07-29 DIAGNOSIS — F17210 Nicotine dependence, cigarettes, uncomplicated: Secondary | ICD-10-CM | POA: Diagnosis present

## 2019-07-29 DIAGNOSIS — I700 Atherosclerosis of aorta: Secondary | ICD-10-CM | POA: Diagnosis present

## 2019-07-29 DIAGNOSIS — J9601 Acute respiratory failure with hypoxia: Secondary | ICD-10-CM | POA: Diagnosis present

## 2019-07-29 DIAGNOSIS — Z888 Allergy status to other drugs, medicaments and biological substances status: Secondary | ICD-10-CM | POA: Diagnosis not present

## 2019-07-29 DIAGNOSIS — R42 Dizziness and giddiness: Secondary | ICD-10-CM | POA: Diagnosis present

## 2019-07-29 DIAGNOSIS — G4733 Obstructive sleep apnea (adult) (pediatric): Secondary | ICD-10-CM | POA: Diagnosis present

## 2019-07-29 DIAGNOSIS — R1013 Epigastric pain: Secondary | ICD-10-CM | POA: Diagnosis present

## 2019-07-29 DIAGNOSIS — F32A Depression, unspecified: Secondary | ICD-10-CM | POA: Diagnosis present

## 2019-07-29 LAB — CBC WITH DIFFERENTIAL/PLATELET
Abs Immature Granulocytes: 0.04 10*3/uL (ref 0.00–0.07)
Basophils Absolute: 0 10*3/uL (ref 0.0–0.1)
Basophils Relative: 0 %
Eosinophils Absolute: 0.1 10*3/uL (ref 0.0–0.5)
Eosinophils Relative: 1 %
HCT: 49 % (ref 39.0–52.0)
Hemoglobin: 17 g/dL (ref 13.0–17.0)
Immature Granulocytes: 0 %
Lymphocytes Relative: 10 %
Lymphs Abs: 0.9 10*3/uL (ref 0.7–4.0)
MCH: 35.7 pg — ABNORMAL HIGH (ref 26.0–34.0)
MCHC: 34.7 g/dL (ref 30.0–36.0)
MCV: 102.9 fL — ABNORMAL HIGH (ref 80.0–100.0)
Monocytes Absolute: 0.6 10*3/uL (ref 0.1–1.0)
Monocytes Relative: 6 %
Neutro Abs: 7.7 10*3/uL (ref 1.7–7.7)
Neutrophils Relative %: 83 %
Platelets: 122 10*3/uL — ABNORMAL LOW (ref 150–400)
RBC: 4.76 MIL/uL (ref 4.22–5.81)
RDW: 12.9 % (ref 11.5–15.5)
WBC: 9.4 10*3/uL (ref 4.0–10.5)
nRBC: 0 % (ref 0.0–0.2)

## 2019-07-29 LAB — COMPREHENSIVE METABOLIC PANEL
ALT: 63 U/L — ABNORMAL HIGH (ref 0–44)
AST: 49 U/L — ABNORMAL HIGH (ref 15–41)
Albumin: 3.6 g/dL (ref 3.5–5.0)
Alkaline Phosphatase: 88 U/L (ref 38–126)
Anion gap: 12 (ref 5–15)
BUN: 13 mg/dL (ref 8–23)
CO2: 23 mmol/L (ref 22–32)
Calcium: 8.9 mg/dL (ref 8.9–10.3)
Chloride: 107 mmol/L (ref 98–111)
Creatinine, Ser: 1.26 mg/dL — ABNORMAL HIGH (ref 0.61–1.24)
GFR calc Af Amer: >60 mL/min (ref >60)
GFR calc non Af Amer: 60 mL/min — ABNORMAL LOW (ref >60)
Glucose, Bld: 164 mg/dL — ABNORMAL HIGH (ref 70–99)
Potassium: 4.6 mmol/L (ref 3.5–5.1)
Sodium: 142 mmol/L (ref 135–145)
Total Bilirubin: 1.1 mg/dL (ref 0.3–1.2)
Total Protein: 6.6 g/dL (ref 6.5–8.1)

## 2019-07-29 LAB — BLOOD GAS, VENOUS
Acid-Base Excess: 1.2 mmol/L (ref 0.0–2.0)
Bicarbonate: 26.6 mmol/L (ref 20.0–28.0)
O2 Saturation: 47.1 %
Patient temperature: 37
pCO2, Ven: 44 mmHg (ref 44.0–60.0)
pH, Ven: 7.39 (ref 7.250–7.430)
pO2, Ven: <31 mmHg — CL (ref 32.0–45.0)

## 2019-07-29 LAB — SARS CORONAVIRUS 2 BY RT PCR (HOSPITAL ORDER, PERFORMED IN ~~LOC~~ HOSPITAL LAB): SARS Coronavirus 2: NEGATIVE

## 2019-07-29 LAB — APTT: aPTT: 28 seconds (ref 24–36)

## 2019-07-29 LAB — TROPONIN I (HIGH SENSITIVITY)
Troponin I (High Sensitivity): 93 ng/L — ABNORMAL HIGH (ref <18)
Troponin I (High Sensitivity): 116 ng/L (ref <18)

## 2019-07-29 LAB — HEPARIN LEVEL (UNFRACTIONATED): Heparin Unfractionated: 0.66 IU/mL (ref 0.30–0.70)

## 2019-07-29 LAB — PROTIME-INR
INR: 1.1 (ref 0.8–1.2)
Prothrombin Time: 13.8 seconds (ref 11.4–15.2)

## 2019-07-29 LAB — GLUCOSE, CAPILLARY: Glucose-Capillary: 99 mg/dL (ref 70–99)

## 2019-07-29 LAB — BRAIN NATRIURETIC PEPTIDE: B Natriuretic Peptide: 498 pg/mL — ABNORMAL HIGH (ref 0.0–100.0)

## 2019-07-29 MED ORDER — SODIUM CHLORIDE 0.9 % IV SOLN
12.0000 mg | Freq: Once | INTRAVENOUS | Status: AC
  Administered 2019-07-30: 01:00:00 12 mg via INTRAVENOUS
  Filled 2019-07-29: qty 12

## 2019-07-29 MED ORDER — HEPARIN BOLUS VIA INFUSION
5000.0000 [IU] | Freq: Once | INTRAVENOUS | Status: AC
  Administered 2019-07-29: 20:00:00 5000 [IU] via INTRAVENOUS
  Filled 2019-07-29: qty 5000

## 2019-07-29 MED ORDER — IOHEXOL 350 MG/ML SOLN
75.0000 mL | Freq: Once | INTRAVENOUS | Status: AC | PRN
  Administered 2019-07-29: 75 mL via INTRAVENOUS

## 2019-07-29 MED ORDER — THIAMINE HCL 100 MG/ML IJ SOLN
100.0000 mg | Freq: Every day | INTRAMUSCULAR | Status: DC
  Administered 2019-07-30: 10:00:00 100 mg via INTRAVENOUS
  Filled 2019-07-29: qty 2

## 2019-07-29 MED ORDER — SERTRALINE HCL 100 MG PO TABS
100.0000 mg | ORAL_TABLET | Freq: Every day | ORAL | Status: DC
  Administered 2019-07-30 – 2019-08-02 (×4): 100 mg via ORAL
  Filled 2019-07-29 (×4): qty 1

## 2019-07-29 MED ORDER — LIDOCAINE HCL 1 % IJ SOLN
INTRAMUSCULAR | Status: AC
  Administered 2019-07-30
  Filled 2019-07-29: qty 20

## 2019-07-29 MED ORDER — FOLIC ACID 5 MG/ML IJ SOLN
1.0000 mg | Freq: Every day | INTRAMUSCULAR | Status: DC
  Administered 2019-07-30: 12:00:00 1 mg via INTRAVENOUS
  Filled 2019-07-29 (×2): qty 0.2

## 2019-07-29 MED ORDER — TRIAMCINOLONE ACETONIDE 55 MCG/ACT NA AERO
1.0000 | INHALATION_SPRAY | Freq: Every day | NASAL | Status: DC
  Administered 2019-07-31 – 2019-08-02 (×3): 1 via NASAL
  Filled 2019-07-29: qty 10.8

## 2019-07-29 MED ORDER — HEPARIN (PORCINE) 25000 UT/250ML-% IV SOLN
1500.0000 [IU]/h | INTRAVENOUS | Status: DC
  Administered 2019-07-29: 1500 [IU]/h via INTRAVENOUS
  Filled 2019-07-29: qty 250

## 2019-07-29 MED ORDER — CHLORHEXIDINE GLUCONATE CLOTH 2 % EX PADS
6.0000 | MEDICATED_PAD | Freq: Every day | CUTANEOUS | Status: DC
  Administered 2019-07-30 – 2019-08-01 (×3): 6 via TOPICAL

## 2019-07-29 MED ORDER — ATORVASTATIN CALCIUM 10 MG PO TABS
10.0000 mg | ORAL_TABLET | Freq: Every day | ORAL | Status: DC
  Administered 2019-07-30 – 2019-08-01 (×3): 10 mg via ORAL
  Filled 2019-07-29 (×4): qty 1

## 2019-07-29 MED ORDER — ORAL CARE MOUTH RINSE
15.0000 mL | Freq: Two times a day (BID) | OROMUCOSAL | Status: DC
  Administered 2019-07-30 (×2): 15 mL via OROMUCOSAL

## 2019-07-29 MED ORDER — FENTANYL CITRATE (PF) 100 MCG/2ML IJ SOLN
INTRAMUSCULAR | Status: AC | PRN
  Administered 2019-07-29 – 2019-07-30 (×2): 25 ug via INTRAVENOUS
  Administered 2019-07-30: via INTRAVENOUS

## 2019-07-29 MED ORDER — LACTATED RINGERS IV SOLN
INTRAVENOUS | Status: DC
  Administered 2019-07-29 – 2019-08-01 (×3): via INTRAVENOUS

## 2019-07-29 MED ORDER — FENTANYL CITRATE (PF) 100 MCG/2ML IJ SOLN
INTRAMUSCULAR | Status: AC
  Administered 2019-07-30: via INTRAVENOUS
  Filled 2019-07-29: qty 2

## 2019-07-29 MED ORDER — HEPARIN (PORCINE) 25000 UT/250ML-% IV SOLN
1750.0000 [IU]/h | INTRAVENOUS | Status: DC
  Administered 2019-07-29: 22:00:00 1500 [IU]/h via INTRAVENOUS
  Administered 2019-07-30: 09:00:00 1750 [IU]/h via INTRAVENOUS
  Filled 2019-07-29: qty 250

## 2019-07-29 MED ORDER — MIDAZOLAM HCL 2 MG/2ML IJ SOLN
INTRAMUSCULAR | Status: AC | PRN
  Administered 2019-07-29 – 2019-07-30 (×2): 0.5 mg via INTRAVENOUS
  Administered 2019-07-30: via INTRAVENOUS

## 2019-07-29 MED ORDER — MIDAZOLAM HCL 2 MG/2ML IJ SOLN
INTRAMUSCULAR | Status: AC
  Administered 2019-07-30: via INTRAVENOUS
  Filled 2019-07-29: qty 2

## 2019-07-29 MED ORDER — SODIUM CHLORIDE 0.9 % IV BOLUS
500.0000 mL | Freq: Once | INTRAVENOUS | Status: AC
  Administered 2019-07-29: 500 mL via INTRAVENOUS

## 2019-07-29 NOTE — H&P (Addendum)
NAME:  Derrick Couey., MRN:  [id number], DOB:  [birth year], LOS: 0 ADMISSION DATE:  (Not on file), CONSULTATION DATE:  07/29/2019 REFERRING MD:  [facility], CHIEF COMPLAINT:  PE   Brief History   63 year old male with hx of recurrent DVT and reported non compliance with Xarelto presenting with progressive SOB x 5 days, DOE, fatigue and mild epigastric pain found to be hypoxic with large large saddle PE with large clot burden and evidence of right heart strain on CTA with RV/ LV ratio of 2.  Started on heparin and transferred to [facility] for further ICU monitoring and IR consult for possible EKOS.   History of present illness    63 year old male with history of current tobacco abuse, sleep apnea- uses bipap, hypertension, hyperlipidemia, reccurrent DVT of the RLE with reported non-compliance of Xarelto, previous treated prostate cancer in 2017, GERD, anxiety and depression who initially presented to the clinic for progressive shortness of breath since Sunday with significant dyspnea on exertion, fatigue, and mild epigastric crampy pain, found to be hypoxic in the 70's on room air.  Reports he does get some dizziness and lightheadedness but denies syncope.  Sent to [facility] ER for further evaluation by EMS and treated with NRB and ASA given.  Denies fever, cough, hemoptysis, chest pain, palpitations, vomiting, change in stool or weight, leg swelling, recent travel, sick contacts or exposure to [place].  Last oncology follow up in February 2020 at [facility] had undetectable PSA.  Reports he stopped taking Xarelto 6 months ago because he did not like the arm bruising it caused.  He takes for recurrent DVT in his RLE (dates back to before 2012), reports he thinks due to old orthopedic/ sports injury.  Reports he still bruises and bleeds easily.  Denies any recent surgeries, head or GI bleeds, family history of blood disorders.  Smokes 1 ppd and around 7-8 beers daily.  Denies any prior alcohol withdrawal or  problems stopping.  He is laid off, previously worked at [employer] and state his daughter and her husband live with him.     In Er, he was mildly tachycardic, hypoxic on room air in the 70's which improved on NRB, afebrile and blood pressure initially soft but then stable.  Labs noted for glucose 164, sCr 1.26, AST 49, ALT 63, BNP 498, hs-trop 93, normal coags, EKG noted for ST 101 with evidence of s1q3t3 changes. CTA PE performed showed large large saddle PE with large clot burden and evidence of right heart strain on CTA with RV/ LV ratio of 2.  He was started on IV heparin.  Plans for EKOS however, no ICU beds at [facility] available.  To be transferred to [facility], PCCM to admit.  Past Medical History  Smoker, sleep apnea, HTN, HLD, recurrent DVT of RLE with reported non-compliance with Xarelto, prostate cancer 2017, anxiety/ depression, GERD  Significant Hospital Events   8/31 tx from [facility] ED/ admit to [facility]  Consults:  IR  Procedures:   Significant Diagnostic Tests:  8/13 CTA PE >> 1. Examination is positive for bilateral pulmonary emboli with large thromboembolic burden including saddle embolus. Evidence of right heart strain with straightening of the intraventricular septum with RV to LV ratio of 2. 2. Left upper lobe/lingular perihilar consolidation with air bronchograms and slight surrounding ground-glass opacity. Findings are suspicious for pneumonia, however recommend radiographic follow-up after course of treatment to ensure resolution to exclude underlying malignancy. 3.  Aortic Atherosclerosis and Emphysema  Micro Data:  8/13 SARS CoV-2 >> negative  Antimicrobials:   Interim history/subjective:  Currently remains on NRB 15L   Objective   There were no vitals taken for this visit.        Intake/Output Summary (Last 24 hours) at 07/29/2019 2133 Last data filed at 07/29/2019 2117 Gross per 24 hour  Intake 500 ml  Output -  Net 500 ml   There were no vitals filed for  this visit.  Examination: General:  Adult male lying in bed in NAD HEENT: MM pink/moist, pupils 4/reactive, no JVD Neuro: A/O x 4, MAE CV: SR, no murmur PULM:  Clear throughout GI: soft, no guarding, bs active  Extremities: warm/dry, no obvious edema/ warmth or calf pain, appears to have some fullness around right ankle- patient reports no new swelling. Skin: no rashes, tan, minor bruises to arms  Resolved Hospital Problem list     Assessment & Plan:   Acute hypoxic respiratory failure Saddle PE with large clot burden and evidence of right heart strain with RV/LV ratio 2, EKG with s1q3t3 pattern, +hs-trop / BNP P:  ICU admit Continue supplemental O2 for sat goal >92% IV heparin Trend hs-trop/ EKG Consult IR for EKOS - spoke with Dr. Kathlene Cote who is planning on proceeding with EKOS tonight  TTE in am  Bilateral LE [place] r/o DVT NPO Consider systemic lytic therapies if becomes hemodynamically unstable  Left upper lobe/ lingular consolidation with GGO and air bronchograms - ddx pna vs less likely pulm infarct vs underlying malignancy  P:  No fever, leukocytosis, clinical symptoms Assess PCT Monitor clinically Consider f/u imaging in several weeks  Mild AKI- previous sCr 0.9 (05/19/2019) P:  Gentle IV hydration Trend BMP / urinary output Replace electrolytes as indicated Avoid nephrotoxic agents, ensure adequate renal perfusion   Mild elevated AST/ ALT with epigastric pain - could be component of RV failure  P:  LFTs in am Check lipase Consider further imaging/ [place]  OSA on bipap per patient P:  Hold on BiPAP for now, given potential to decrease preload with positive pressure and concern for significant right heart strain.  Likely can resume tomorrow    Hx HTN P:  Hold home norvasc   Hx HLD P:  Continue home lipitor  Hx anxiety depression P:  Continue home zoloft  Hyperglycemia - likely stress induced, had normal HgbA1c in June 2020 P:  Trend on BMP, add  SSI if > 180  ETOH use - 6-7 beers /daily  P:  At risk for withdrawal Empiric daily thiamine and folate   Best practice:  Diet: NPO Pain/Anxiety/Delirium protocol (if indicated): n/a VAP protocol (if indicated): n/a DVT prophylaxis: heparin IV GI prophylaxis: PPI for GERD Glucose control: add SSI if glucose >180 Mobility: BR Code Status: full  Family Communication: patient updated on plan of care.  Daughter is [place], [place]  Disposition: ICU    Labs   CBC: Recent Labs  Lab 07/29/19 1650  WBC 9.4  NEUTROABS 7.7  HGB 17.0  HCT 49.0  MCV 102.9*  PLT 122*    Basic Metabolic Panel: Recent Labs  Lab 07/29/19 1650  NA 142  K 4.6  CL 107  CO2 23  GLUCOSE 164*  BUN 13  CREATININE 1.26*  CALCIUM 8.9   GFR: Estimated Creatinine Clearance: 74.7 mL/min (A) (by C-G formula based on SCr of 1.26 mg/dL (H)). Recent Labs  Lab 07/29/19 1650  WBC 9.4    Liver Function Tests: Recent Labs  Lab  07/29/19 1650  AST 49*  ALT 63*  ALKPHOS 88  BILITOT 1.1  PROT 6.6  ALBUMIN 3.6   No results for input(s): LIPASE, AMYLASE in the last 168 hours. No results for input(s): AMMONIA in the last 168 hours.  ABG    Component Value Date/Time   HCO3 26.6 07/29/2019 1658   O2SAT 47.1 07/29/2019 1658     Coagulation Profile: Recent Labs  Lab 07/29/19 1650  INR 1.1    Cardiac Enzymes: No results for input(s): CKTOTAL, CKMB, CKMBINDEX, TROPONINI in the last 168 hours.  HbA1C: No results found for: HGBA1C  CBG: No results for input(s): GLUCAP in the last 168 hours.  Review of Systems:   Review of Systems  Constitutional: Positive for malaise/fatigue. Negative for chills, fever and weight loss.  Respiratory: Positive for shortness of breath. Negative for cough, hemoptysis, sputum production and wheezing.   Cardiovascular: Negative for chest pain, palpitations, orthopnea and leg swelling.  Gastrointestinal: Positive for abdominal pain. Negative for blood in stool,  constipation, diarrhea, melena, nausea and vomiting.  Musculoskeletal: Negative for falls.  Neurological: Positive for dizziness. Negative for focal weakness, seizures, loss of consciousness and weakness.  Endo/Heme/Allergies: Bruises/bleeds easily.   Past Medical History  He,  has a past medical history of Allergic rhinitis, Anxiety and depression, BPH (benign prostatic hyperplasia), DVT (deep venous thrombosis) ([place]), Elevated PSA, GERD (gastroesophageal reflux disease), HLD (hyperlipidemia), HLD (hyperlipidemia), HTN (hypertension), Peyronie's disease, and Sleep apnea.   Surgical History    Past Surgical History:  Procedure Laterality Date  . TONSILLECTOMY       Social History   reports that he has been smoking cigarettes. He has been smoking about 1.00 pack per day. He does not have any smokeless tobacco history on file. He reports current alcohol use. He reports that he does not use drugs.   Family History   His family history includes Lung cancer in his mother. There is no history of Prostate cancer or Kidney disease.   Allergies Allergies  Allergen Reactions  . Losartan Hives  . Codeine Anxiety  . Lisinopril Anxiety     Home Medications  Prior to Admission medications   Medication Sig Start Date End Date Taking? Authorizing Provider  amLODipine (NORVASC) 10 MG tablet Take 10 mg by mouth daily. 06/02/15   [provider]  atorvastatin (LIPITOR) 10 MG tablet TAKE 1 TABLET (10 MG TOTAL) BY MOUTH ONCE DAILY. 06/29/15   [provider]  Cinnamon 500 MG capsule Take 500 mg by mouth daily.     [provider]  RA KRILL OIL 500 MG CAPS Take 500 mg by mouth daily.     [provider]  sertraline (ZOLOFT) 100 MG tablet Take 150 mg by mouth daily.  06/02/15   [provider]  triamcinolone (NASACORT) 55 MCG/ACT AERO nasal inhaler Place 1 spray into the nose daily.     [provider]  XARELTO 20 MG TABS tablet TAKE 1 TABLET (20 MG  TOTAL) BY MOUTH DAILY WITH DINNER. 05/08/15   [provider]     Critical care time: 40 mins     Kennieth Rad, MSN, AGACNP-BC [facility] Pgr: [phone] or if no answer [phone] 07/29/2019, 10:37 PM

## 2019-07-29 NOTE — ED Provider Notes (Addendum)
[facility] Emergency Department Provider Note  ____________________________________________   First MD Initiated Contact with Patient 07/29/19 1644     (approximate)  I have reviewed the triage vital signs and the nursing notes.  History  Chief Complaint Respiratory Distress    HPI Derrick Reyes. is a 63 y.o. male with history of hypertension, hyperlipidemia, DVT (admits to recent noncompliance with Xarelto) who presents emergency department for progressive shortness of breath since Sunday.  Also specifically reports significant dyspnea on exertion and generalized fatigue.  He denies any productive cough or hemoptysis.  He denies any associated chest pain.  No fevers.  He has some associated mild, Reyes, intermittent, non-radiating epigastric abdominal pain, but no vomiting or diarrhea.  He denies any sick contacts, or known exposures to anybody with coronavirus.  He denies any recent travel or leg swelling.  He was seen in the clinic, and noted to be hypoxic to the high 70s on room air, improved to mid 90s on nonrebreather.  He denies any history of COPD, asthma, or heart failure.         Past Medical Hx Past Medical History:  Diagnosis Date  . Allergic rhinitis   . Anxiety and depression   . BPH (benign prostatic hyperplasia)   . DVT (deep venous thrombosis) ([place])   . Elevated PSA   . GERD (gastroesophageal reflux disease)   . HLD (hyperlipidemia)   . HLD (hyperlipidemia)   . HTN (hypertension)   . Peyronie's disease   . Sleep apnea     Problem List Patient Active Problem List   Diagnosis Date Noted  . Erectile dysfunction of organic origin 01/31/2016  . Elevated PSA 08/06/2015  . Peyronie disease 08/06/2015  . BPH with obstruction/lower urinary tract symptoms 08/06/2015  . Hay fever 03/22/2015  . Essential (primary) hypertension 03/22/2015  . Combined fat and carbohydrate induced hyperlipemia 03/22/2015  . Anxiety and depression  08/10/2014  . Deep vein thrombosis (DVT) ([place]) 08/10/2014  . Acid reflux 08/10/2014  . 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 Apnea, sleep 08/10/2014    Past Surgical Hx Past Surgical History:  Procedure Laterality Date  . TONSILLECTOMY      Medications Prior to Admission medications   Medication Sig Start Date End Date Taking? Authorizing Provider  amLODipine (NORVASC) 10 MG tablet Take 10 mg by mouth daily. 06/02/15   [provider]  atorvastatin (LIPITOR) 10 MG tablet TAKE 1 TABLET (10 MG TOTAL) BY MOUTH ONCE DAILY. 06/29/15   [provider]  Cinnamon 500 MG capsule     [provider]  diazepam (VALIUM) 10 MG tablet Take one tablet 30 minutes prior to your biopsy. 02/26/16   Zara Council A, PA-C  pentoxifylline (TRENTAL) 400 MG CR tablet Take twice daily 01/31/16   McGowan, Larene Beach A, PA-C  RA KRILL OIL 500 MG CAPS     [provider]  sertraline (ZOLOFT) 100 MG tablet Take 100 mg by mouth daily. 06/02/15   [provider]  sulfamethoxazole-trimethoprim (BACTRIM DS,SEPTRA DS) 800-160 MG tablet Take 1 tablet by mouth 2 (two) times daily. 05/24/16   Norval Gable, MD  triamcinolone (NASACORT) 55 MCG/ACT AERO nasal inhaler Place into the nose.    [provider]  XARELTO 20 MG TABS tablet TAKE 1 TABLET (20 MG TOTAL) BY MOUTH DAILY WITH DINNER. 05/08/15   [provider]    Allergies Lisinopril, Losartan, and Codeine  Family Hx Family History  Problem Relation Age of Onset  .  Lung cancer Mother   . Prostate cancer Neg Hx   . Kidney disease Neg Hx     Social Hx Social History   Tobacco Use  . Smoking status: Current Every Day Smoker    Packs/day: 1.00    Types: Cigarettes  Substance Use Topics  . Alcohol use: Yes    Alcohol/week: 0.0 standard drinks    Comment: moderate  . Drug use: No     Review of Systems  Constitutional: Negative for fever. Negative for chills. Eyes: Negative for visual changes. ENT:  Negative for sore throat. Cardiovascular: Negative for chest pain. Respiratory: + for shortness of breath. Gastrointestinal: + for epigastric abdominal pain. Negative for nausea. Negative for vomiting. Genitourinary: Negative for dysuria. Musculoskeletal: Negative for leg swelling. Skin: Negative for rash. Neurological: Negative for for headaches.   Physical Exam  Vital Signs: ED Triage Vitals  Enc Vitals Group     BP 07/29/19 1634 106/82     Pulse Rate 07/29/19 1634 99     Resp 07/29/19 1634 (!) 22     Temp 07/29/19 1634 97.9 F (36.6 C)     Temp Source 07/29/19 1634 Oral     SpO2 07/29/19 1634 94 %     Weight 07/29/19 1631 220 lb (99.8 kg)     Height 07/29/19 1631 6\' 2"  (1.88 m)     Head Circumference --      Peak Flow --      Pain Score 07/29/19 1631 2     Pain Loc --      Pain Edu? --      Excl. in [place]? --     Constitutional: Alert and oriented.  Eyes: Conjunctivae clear. Sclera anicteric. Head: Normocephalic. Atraumatic. Nose: No congestion. No rhinorrhea. Mouth/Throat: Mucous membranes are moist.  Neck: No stridor.   Cardiovascular: Mild tachycardia, regular rhythm. No murmurs. Extremities well perfused. Respiratory: Normal respiratory effort.  Speaking in full sentences.  Lungs CTAB.  Hypoxic to the 70s on room air.  Placed on nonrebreather with improvement to mid 90s. Gastrointestinal: Soft.  Mild epigastric discomfort with palpation, no rebound or guarding.  Remainder of abdomen is soft and nontender.  No distention.  Musculoskeletal: No lower extremity edema or asymmetry. Neurologic:  Normal speech and language. No gross focal neurologic deficits are appreciated.  Skin: Skin is warm, dry and intact. No rash noted. Psychiatric: Mood and affect are appropriate for situation.  EKG  Personally reviewed.   Rate: Tachycardic, 101 Rhythm: Sinus Axis: normal Intervals: within normal limits T wave inversions in II, III, aVF, new from prior   Radiology  CT  PE: massive saddle PE  BSUS: concern for R heart strain   Procedures  Procedure(s) performed (including critical care):  .Critical Care Performed by: Lilia Pro., MD Authorized by: Lilia Pro., MD   Critical care provider statement:    Critical care time (minutes):  42   Critical care was necessary to treat or prevent imminent or life-threatening deterioration of the following conditions:  Circulatory failure and respiratory failure   Critical care was time spent personally by me on the following activities:  Discussions with consultants, evaluation of patient's response to treatment, examination of patient, ordering and performing treatments and interventions, ordering and review of laboratory studies, ordering and review of radiographic studies, pulse oximetry, re-evaluation of patient's condition, obtaining history from patient or surrogate and review of old charts     Initial Impression / Assessment and Plan / ED Course  63  y.o. male  with history of hypertension, hyperlipidemia, DVT (admits to recent noncompliance with Xarelto) who presents emergency department for progressive shortness of breath and DOE since Sunday, with notable hypoxia to 70s on RA.  Placed on NRB with improvement to 90s. Lungs clear. Speaking in full sentences in no significant respiratory distress. Texting on phone. No LE swelling.   Ddx includes PE, COVID, pulmonary infection, ACS, new onset heart failure (though does not appear volume overloaded).  Plan for labs, imaging, supplemental oxygen as above, COVID testing.  6:55 PM On my read of CT patient has a massive saddle PE. Evidence of R heart strain on bedside ultrasound, as well as HS troponin leak. Ordered heparin. Discussing with vascular here regarding option for catheter directed tPA.  7:10 PM Discussed with vascular here at [facility], they will plan for catheter directed lysis tomorrow AM. Will admit to ICU, on heparin gtt. Discussed with  hospitalist.   7:16 PM Unfortunately, there may not be any ICU beds tonight, charge RN to verify.   7:20 PM There are no more ICU beds here. Will initiate transfer, patient aware and agreeable.   7:35 PM Patient accepted for transfer to [facility] ICU. BP improved to 140s/80s. Remains on NRB at 97% with normal work of breathing, texting on phone.    Final Clinical Impression(s) / ED Diagnosis  Final diagnoses:  Shortness of breath  Hypoxia  Acute saddle pulmonary embolism with acute cor pulmonale ([place])      Note:  This document was prepared using Dragon voice recognition software and may include unintentional dictation errors.     Lilia Pro., MD 07/29/19 2049

## 2019-07-29 NOTE — ED Notes (Signed)
Tammy @ [facility] per Joan Mayans, MD for critical care transfer.

## 2019-07-29 NOTE — ED Triage Notes (Signed)
Patient sent from [facility] due to hypoxia and shortness of breath. Patient reports he has been getting more short of breath since Sunday. Patient was 78% on room air. 93% on 15L non-rebreather. Patient reports upper abdominal pain as well.   Given 324mg  of aspirin by EMS PTA.

## 2019-07-29 NOTE — Progress Notes (Signed)
[place] for heparin Indication: pulmonary embolus  Allergies  Allergen Reactions  . Losartan Hives  . Codeine Anxiety  . Lisinopril Anxiety    Patient Measurements: Height: 6\' 2"  (188 cm) Weight: 220 lb (99.8 kg) IBW/kg (Calculated) : 82.2 Heparin Dosing Weight: 99 kg  Vital Signs: Temp: 97.9 F (36.6 C) (08/13 1634) Temp Source: Oral (08/13 1634) BP: 99/81 (08/13 1830) Pulse Rate: 92 (08/13 1830)  Labs: Recent Labs    07/29/19 1650  HGB 17.0  HCT 49.0  PLT 122*  APTT 28  LABPROT 13.8  INR 1.1  CREATININE 1.26*  TROPONINIHS 93*    Estimated Creatinine Clearance: 74.7 mL/min (A) (by C-G formula based on SCr of 1.26 mg/dL (H)).   Medical History: Past Medical History:  Diagnosis Date  . Allergic rhinitis   . Anxiety and depression   . BPH (benign prostatic hyperplasia)   . DVT (deep venous thrombosis) ([place])   . Elevated PSA   . GERD (gastroesophageal reflux disease)   . HLD (hyperlipidemia)   . HLD (hyperlipidemia)   . HTN (hypertension)   . Peyronie's disease   . Sleep apnea      Assessment: 63 year old male with h/o DVT on Xarelto PTA. Presents with progressive SOB and noted to be hypoxic on RA. Per med rec, last dose of Xarelto unknown. Patient also admits to recent non-compliance per EDP note. Patient with massive saddle PE per EDP, consult to dose heparin. EDP has also reached out to vascular about possible catheter directed tPA. Discussed that protocol is to generally do tPA followed by heparin. Will start heparin drip now per EDP. Plan to f/u tPA recommendations from vascular.  Goal of Therapy:  Heparin level 0.3-0.7 units/ml aPTT 66-102 seconds Monitor platelets by anticoagulation protocol: Yes   Plan:  Baseline labs ordered. Last dose of Xarelto unclear, expect at least 24 hours if not longer with recent non-compliance. Regardless, patient with massive saddle PE and will therefore start heparin now. Will  give heparin 5000 unit bolus followed by heparin drip at 1500 units/hr. APTT ordered for tomorrow at 0200. CBC and HL with morning labs. Will follow HL once correlates or baseline HL < 0.1.  Tawnya Crook, PharmD 07/29/2019,7:07 PM

## 2019-07-29 NOTE — ED Notes (Addendum)
Pt voices good understanding of necessity for transfer to [facility] for ICU services as explained by Dr Joan Mayans; pt signs consent; informed of need for NPO status & glycerin swabs given to pt at request

## 2019-07-29 NOTE — Consult Note (Signed)
Chief Complaint: Patient was seen in consultation today for pulmonary thrombolysis at the request of PCCM.  Referring Physician(s): Kennieth Rad, NP, Collier Bullock, MD  Patient Status: [facility] - In-pt  History of Present Illness: [facility]. is a 63 y.o. male transferred earlier tonight from [facility] with acute saddle PE and bilateral submassive PE with right heart strain and cor pulmonale. On NRB with O2 sats around 93-94% and not improving on IV heparin. Not significantly hypotensive but mildly tachycardic, dyspneic with any activity and tonight has had some pre-syncopal symptoms. CTA shows large thrombus burden bilaterally and saddle thrombus with severely dilated RV. Positive troponin leak and EKG changes. Has history of recurrent RLE DVT and stopped taking anticoagulation 6 months ago. History of easy bruising. Labs show platelet count of 122, normal coags, normal Hgb.  Past Medical History:  Diagnosis Date   Allergic rhinitis    Anxiety and depression    BPH (benign prostatic hyperplasia)    DVT (deep venous thrombosis) ([facility])    Elevated PSA    GERD (gastroesophageal reflux disease)    HLD (hyperlipidemia)    HLD (hyperlipidemia)    HTN (hypertension)    Peyronie's disease    Sleep apnea     Past Surgical History:  Procedure Laterality Date   TONSILLECTOMY      Allergies: Losartan, Codeine, and Lisinopril  Medications: Prior to Admission medications   Medication Sig Start Date End Date Taking? Authorizing Provider  amLODipine (NORVASC) 10 MG tablet Take 10 mg by mouth daily. 06/02/15   [provider]  atorvastatin (LIPITOR) 10 MG tablet TAKE 1 TABLET (10 MG TOTAL) BY MOUTH ONCE DAILY. 06/29/15   [provider]  Cinnamon 500 MG capsule Take 500 mg by mouth daily.     [provider]  RA KRILL OIL 500 MG CAPS Take 500 mg by mouth daily.     [provider]  sertraline (ZOLOFT) 100 MG tablet Take 150 mg by mouth  daily.  06/02/15   [provider]  triamcinolone (NASACORT) 55 MCG/ACT AERO nasal inhaler Place 1 spray into the nose daily.     [provider]  XARELTO 20 MG TABS tablet TAKE 1 TABLET (20 MG TOTAL) BY MOUTH DAILY WITH DINNER. 05/08/15   [provider]     Family History  Problem Relation Age of Onset   Lung cancer Mother    Prostate cancer Neg Hx    Kidney disease Neg Hx     Social History   Socioeconomic History   Marital status: Married    Spouse name: Not on file   Number of children: Not on file   Years of education: Not on file   Highest education level: Not on file  Occupational History   Not on file  Social Needs   Financial resource strain: Not on file   Food insecurity    Worry: Not on file    Inability: Not on file   Transportation needs    Medical: Not on file    Non-medical: Not on file  Tobacco Use   Smoking status: Current Every Day Smoker    Packs/day: 1.00    Types: Cigarettes  Substance and Sexual Activity   Alcohol use: Yes    Alcohol/week: 0.0 standard drinks    Comment: moderate   Drug use: No   Sexual activity: Not on file  Lifestyle   Physical activity    Days per week: Not on file  Minutes per session: Not on file   Stress: Not on file  Relationships   Social connections    Talks on phone: Not on file    Gets together: Not on file    Attends religious service: Not on file    Active member of club or organization: Not on file    Attends meetings of clubs or organizations: Not on file    Relationship status: Not on file  Other Topics Concern   Not on file  Social History Narrative   Not on file     Review of Systems: A 12 point ROS discussed and pertinent positives are indicated in the HPI above.  All other systems are negative.  Review of Systems  Constitutional: Negative.   HENT: Negative.   Respiratory: Positive for shortness of breath.   Gastrointestinal:       Epigastric  pain  Genitourinary: Negative.   Musculoskeletal: Negative.   Neurological: Positive for light-headedness.    Vital Signs: BP (!) 123/93    Pulse 93    Temp 98.1 F (36.7 C) (Axillary)    Resp 16    Ht 6\' 2"  (1.88 m)    Wt 91.9 kg    SpO2 98%    BMI 26.01 kg/m   Physical Exam Vitals signs reviewed.  Constitutional:      General: He is in acute distress.  Cardiovascular:     Rate and Rhythm: Normal rate and regular rhythm.     Heart sounds: No murmur. No friction rub. No gallop.   Pulmonary:     Effort: Respiratory distress present.     Breath sounds: No stridor. No wheezing, rhonchi or rales.  Abdominal:     General: Abdomen is flat.     Palpations: Abdomen is soft.  Musculoskeletal:        General: No swelling or tenderness.  Skin:    General: Skin is warm and dry.  Neurological:     General: No focal deficit present.     Mental Status: He is oriented to person, place, and time.     Imaging: Ct Angio Chest Pe W/cm &/or Wo Cm  Result Date: 07/29/2019 CLINICAL DATA:  Hypoxia and shortness of breath. Provided history of interstitial lung disease. EXAM: CT ANGIOGRAPHY CHEST WITH CONTRAST TECHNIQUE: Multidetector CT imaging of the chest was performed using the standard protocol during bolus administration of intravenous contrast. Multiplanar CT image reconstructions and MIPs were obtained to evaluate the vascular anatomy. CONTRAST:  70mL OMNIPAQUE IOHEXOL 350 MG/ML SOLN COMPARISON:  None. FINDINGS: Cardiovascular: Examination is positive for bilateral pulmonary emboli with large thromboembolic burden. Moderate saddle embolus with filling defects extending into all lobes and segmental branches of both lungs. Evidence of right heart strain with straightening of the intraventricular septum and RV to LV ratio of 2. Contrast refluxes into the hepatic veins and IVC. Thoracic aorta is normal in caliber with mild atherosclerosis. No evidence of aortic dissection. Mediastinum/Nodes: No  enlarged mediastinal lymph nodes. No definite hilar adenopathy. The esophagus is decompressed. No visualized thyroid nodule. Lungs/Pleura: Left upper lobe/lingular perihilar consolidation with air bronchograms in slight surrounding ground-glass opacity. No peripheral opacities to suggest pulmonary infarct. Minimal apically emphysema. Trachea and mainstem bronchi are patent. No pleural fluid. Upper Abdomen: Gallstone, partially included. No acute findings. Musculoskeletal: There are no acute or suspicious osseous abnormalities. Review of the MIP images confirms the above findings. IMPRESSION: 1. Examination is positive for bilateral pulmonary emboli with large thromboembolic burden including saddle embolus. Evidence  of right heart strain with straightening of the intraventricular septum with RV to LV ratio of 2. The presence of right heart strain has been associated with an increased risk of morbidity and mortality. Please activate Code PE by paging [phone]. 2. Left upper lobe/lingular perihilar consolidation with air bronchograms and slight surrounding ground-glass opacity. Findings are suspicious for pneumonia, however recommend radiographic follow-up after course of treatment to ensure resolution to exclude underlying malignancy. Aortic Atherosclerosis (ICD10-I70.0) and Emphysema (ICD10-J43.9). Critical Value/emergent results were called by telephone at the time of interpretation on 07/29/2019 at 7:06 pm to Dr. Judson Roch MONKS , who verbally acknowledged these results. Electronically Signed   By: Keith Rake M.D.   On: 07/29/2019 19:11    Labs:  CBC: Recent Labs    07/29/19 1650  WBC 9.4  HGB 17.0  HCT 49.0  PLT 122*    COAGS: Recent Labs    07/29/19 1650  INR 1.1  APTT 28    BMP: Recent Labs    07/29/19 1650  NA 142  K 4.6  CL 107  CO2 23  GLUCOSE 164*  BUN 13  CALCIUM 8.9  CREATININE 1.26*  GFRNONAA 60*  GFRAA >60    LIVER FUNCTION TESTS: Recent Labs     07/29/19 1650  BILITOT 1.1  AST 49*  ALT 63*  ALKPHOS 88  PROT 6.6  ALBUMIN 3.6    Assessment and Plan:  Acute saddle PE with submassive bilateral thrombus and cor pulmonale. Clinically somewhat tenuous on NRB and not improving while here after transport on IV heparin with some hemodynamic compromise and syncopal symptoms. No contraindication to lytic therapy. Platelet count low at 122K, possibly related to chronic alcohol use and liver disease. Thrombocytopenia not dangerously low, however. Some evidence of mild AKI compared to baseline and received contrast for CTA earlier this evening. Will try to limit contrast use for procedure.  Mr. Artley would benefit from starting bilateral ultrasound assisted thrombolytic therapy to treat bilateral submassive PE tonight. He could become unstable if we waited until tomorrow AM given current clinical condition. Will proceed with initiation of EKOS lytic therapy bilaterally tonight.  Risks and benefits discussed with the patient including, but not limited to bleeding, possible life threatening bleeding and need for blood product transfusion, vascular injury, stroke, contrast induced renal failure, limb loss and infection. All of the patient's questions were answered, patient is agreeable to proceed. Consent signed and in chart.  Thank you for this interesting consult.  I greatly enjoyed meeting [employer]. and look forward to participating in their care.  A copy of this report was sent to the requesting provider on this date.  Electronically Signed: Azzie Roup, MD 07/29/2019, 10:44 PM     I spent a total of 40 Minutes in face to face in clinical consultation, greater than 50% of which was counseling/coordinating care for bilateral pulmonary embolism.

## 2019-07-29 NOTE — ED Notes (Signed)
Report given to [facility]. 

## 2019-07-29 NOTE — ED Notes (Signed)
Report called to care nurse Stephanie at 2-M, Bed 13

## 2019-07-30 ENCOUNTER — Encounter (HOSPITAL_COMMUNITY): Payer: Self-pay | Admitting: Interventional Radiology

## 2019-07-30 ENCOUNTER — Inpatient Hospital Stay (HOSPITAL_COMMUNITY): Payer: Medicare Other

## 2019-07-30 DIAGNOSIS — I2699 Other pulmonary embolism without acute cor pulmonale: Secondary | ICD-10-CM

## 2019-07-30 DIAGNOSIS — I2692 Saddle embolus of pulmonary artery without acute cor pulmonale: Secondary | ICD-10-CM

## 2019-07-30 HISTORY — PX: IR ANGIOGRAM SELECTIVE EACH ADDITIONAL VESSEL: IMG667

## 2019-07-30 HISTORY — PX: IR ANGIOGRAM PULMONARY BILATERAL SELECTIVE: IMG664

## 2019-07-30 HISTORY — PX: IR US GUIDE VASC ACCESS RIGHT: IMG2390

## 2019-07-30 HISTORY — PX: IR THROMB F/U EVAL ART/VEN FINAL DAY (MS): IMG5379

## 2019-07-30 HISTORY — PX: IR INFUSION THROMBOL ARTERIAL INITIAL (MS): IMG5376

## 2019-07-30 LAB — CBC
HCT: 44.4 % (ref 39.0–52.0)
HCT: 42.9 % (ref 39.0–52.0)
HCT: 43.2 % (ref 39.0–52.0)
Hemoglobin: 15.2 g/dL (ref 13.0–17.0)
Hemoglobin: 14.4 g/dL (ref 13.0–17.0)
Hemoglobin: 14.5 g/dL (ref 13.0–17.0)
MCH: 35.8 pg — ABNORMAL HIGH (ref 26.0–34.0)
MCH: 35.6 pg — ABNORMAL HIGH (ref 26.0–34.0)
MCH: 35.5 pg — ABNORMAL HIGH (ref 26.0–34.0)
MCHC: 34.2 g/dL (ref 30.0–36.0)
MCHC: 33.6 g/dL (ref 30.0–36.0)
MCHC: 33.6 g/dL (ref 30.0–36.0)
MCV: 104.7 fL — ABNORMAL HIGH (ref 80.0–100.0)
MCV: 106.2 fL — ABNORMAL HIGH (ref 80.0–100.0)
MCV: 105.9 fL — ABNORMAL HIGH (ref 80.0–100.0)
Platelets: 100 10*3/uL — ABNORMAL LOW (ref 150–400)
Platelets: 83 10*3/uL — ABNORMAL LOW (ref 150–400)
Platelets: 85 10*3/uL — ABNORMAL LOW (ref 150–400)
RBC: 4.24 MIL/uL (ref 4.22–5.81)
RBC: 4.04 MIL/uL — ABNORMAL LOW (ref 4.22–5.81)
RBC: 4.08 MIL/uL — ABNORMAL LOW (ref 4.22–5.81)
RDW: 13.1 % (ref 11.5–15.5)
RDW: 12.9 % (ref 11.5–15.5)
RDW: 12.8 % (ref 11.5–15.5)
WBC: 7.4 10*3/uL (ref 4.0–10.5)
WBC: 7.5 10*3/uL (ref 4.0–10.5)
WBC: 7.3 10*3/uL (ref 4.0–10.5)
nRBC: 0 % (ref 0.0–0.2)
nRBC: 0 % (ref 0.0–0.2)
nRBC: 0 % (ref 0.0–0.2)

## 2019-07-30 LAB — FIBRINOGEN
Fibrinogen: 338 mg/dL (ref 210–475)
Fibrinogen: 329 mg/dL (ref 210–475)
Fibrinogen: 326 mg/dL (ref 210–475)
Fibrinogen: 326 mg/dL (ref 210–475)

## 2019-07-30 LAB — CBC WITH DIFFERENTIAL/PLATELET
Abs Immature Granulocytes: 0.01 10*3/uL (ref 0.00–0.07)
Basophils Absolute: 0 10*3/uL (ref 0.0–0.1)
Basophils Relative: 1 %
Eosinophils Absolute: 0.1 10*3/uL (ref 0.0–0.5)
Eosinophils Relative: 1 %
HCT: 41.1 % (ref 39.0–52.0)
Hemoglobin: 14.2 g/dL (ref 13.0–17.0)
Immature Granulocytes: 0 %
Lymphocytes Relative: 12 %
Lymphs Abs: 0.7 10*3/uL (ref 0.7–4.0)
MCH: 36.1 pg — ABNORMAL HIGH (ref 26.0–34.0)
MCHC: 34.5 g/dL (ref 30.0–36.0)
MCV: 104.6 fL — ABNORMAL HIGH (ref 80.0–100.0)
Monocytes Absolute: 0.4 10*3/uL (ref 0.1–1.0)
Monocytes Relative: 6 %
Neutro Abs: 5 10*3/uL (ref 1.7–7.7)
Neutrophils Relative %: 80 %
Platelets: 78 10*3/uL — ABNORMAL LOW (ref 150–400)
RBC: 3.93 MIL/uL — ABNORMAL LOW (ref 4.22–5.81)
RDW: 12.8 % (ref 11.5–15.5)
WBC: 6.2 10*3/uL (ref 4.0–10.5)
nRBC: 0 % (ref 0.0–0.2)

## 2019-07-30 LAB — COMPREHENSIVE METABOLIC PANEL
ALT: 49 U/L — ABNORMAL HIGH (ref 0–44)
AST: 31 U/L (ref 15–41)
Albumin: 3 g/dL — ABNORMAL LOW (ref 3.5–5.0)
Alkaline Phosphatase: 74 U/L (ref 38–126)
Anion gap: 9 (ref 5–15)
BUN: 9 mg/dL (ref 8–23)
CO2: 22 mmol/L (ref 22–32)
Calcium: 8.1 mg/dL — ABNORMAL LOW (ref 8.9–10.3)
Chloride: 110 mmol/L (ref 98–111)
Creatinine, Ser: 1.06 mg/dL (ref 0.61–1.24)
GFR calc Af Amer: >60 mL/min (ref >60)
GFR calc non Af Amer: >60 mL/min (ref >60)
Glucose, Bld: 107 mg/dL — ABNORMAL HIGH (ref 70–99)
Potassium: 3.9 mmol/L (ref 3.5–5.1)
Sodium: 141 mmol/L (ref 135–145)
Total Bilirubin: 1.4 mg/dL — ABNORMAL HIGH (ref 0.3–1.2)
Total Protein: 5.5 g/dL — ABNORMAL LOW (ref 6.5–8.1)

## 2019-07-30 LAB — HIV ANTIBODY (ROUTINE TESTING W REFLEX): HIV Screen 4th Generation wRfx: NONREACTIVE

## 2019-07-30 LAB — PROCALCITONIN
Procalcitonin: <0.1 ng/mL
Procalcitonin: <0.1 ng/mL

## 2019-07-30 LAB — LIPASE, BLOOD: Lipase: 32 U/L (ref 11–51)

## 2019-07-30 LAB — HEPARIN LEVEL (UNFRACTIONATED)
Heparin Unfractionated: 0.15 IU/mL — ABNORMAL LOW (ref 0.30–0.70)
Heparin Unfractionated: 1.1 IU/mL — ABNORMAL HIGH (ref 0.30–0.70)
Heparin Unfractionated: <0.1 IU/mL — ABNORMAL LOW (ref 0.30–0.70)
Heparin Unfractionated: 0.37 IU/mL (ref 0.30–0.70)

## 2019-07-30 LAB — APTT
aPTT: 58 seconds — ABNORMAL HIGH (ref 24–36)
aPTT: >200 seconds (ref 24–36)

## 2019-07-30 LAB — TROPONIN I (HIGH SENSITIVITY): Troponin I (High Sensitivity): 114 ng/L (ref <18)

## 2019-07-30 LAB — ECHOCARDIOGRAM COMPLETE
Height: 74 in
Weight: 3276.92 oz

## 2019-07-30 LAB — MRSA PCR SCREENING: MRSA by PCR: NEGATIVE

## 2019-07-30 SURGERY — PULMONARY THROMBECTOMY
Anesthesia: Moderate Sedation | Laterality: Bilateral

## 2019-07-30 MED ORDER — SODIUM CHLORIDE 0.9 % IV SOLN
INTRAVENOUS | Status: DC
  Administered 2019-07-30: 12:00:00 via INTRAVENOUS

## 2019-07-30 MED ORDER — SODIUM CHLORIDE 0.9 % IV SOLN: 250.0000 mL | INTRAVENOUS | Status: DC | PRN

## 2019-07-30 MED ORDER — ZOLPIDEM TARTRATE 5 MG PO TABS
5.0000 mg | ORAL_TABLET | Freq: Every evening | ORAL | Status: DC | PRN
  Administered 2019-07-30: 02:00:00 5 mg via ORAL
  Filled 2019-07-30: qty 1

## 2019-07-30 MED ORDER — SODIUM CHLORIDE 0.9% FLUSH
3.0000 mL | Freq: Two times a day (BID) | INTRAVENOUS | Status: DC
  Administered 2019-07-30: 02:00:00 3 mL via INTRAVENOUS

## 2019-07-30 MED ORDER — LIDOCAINE HCL (PF) 1 % IJ SOLN
INTRAMUSCULAR | Status: AC | PRN
  Administered 2019-07-30: 5 mL

## 2019-07-30 MED ORDER — LACTATED RINGERS IV BOLUS: 500.0000 mL | Freq: Once | INTRAVENOUS | Status: DC

## 2019-07-30 MED ORDER — SODIUM CHLORIDE 0.9 % IV SOLN
INTRAVENOUS | Status: DC
  Administered 2019-07-30: 02:00:00 via INTRAVENOUS

## 2019-07-30 MED ORDER — HEPARIN (PORCINE) 25000 UT/250ML-% IV SOLN
2100.0000 [IU]/h | INTRAVENOUS | Status: AC
  Administered 2019-07-30 – 2019-07-31 (×2): 1400 [IU]/h via INTRAVENOUS
  Administered 2019-07-31: 18:00:00 1900 [IU]/h via INTRAVENOUS
  Administered 2019-08-01: 08:00:00 2100 [IU]/h via INTRAVENOUS
  Filled 2019-07-30 (×3): qty 250

## 2019-07-30 MED ORDER — SODIUM CHLORIDE 0.9% FLUSH: 3.0000 mL | INTRAVENOUS | Status: DC | PRN

## 2019-07-30 MED ORDER — ACETAMINOPHEN 325 MG PO TABS
650.0000 mg | ORAL_TABLET | Freq: Four times a day (QID) | ORAL | Status: DC | PRN
  Administered 2019-07-30 – 2019-08-01 (×4): 650 mg via ORAL
  Filled 2019-07-30 (×4): qty 2

## 2019-07-30 NOTE — Progress Notes (Signed)
Right side groin prepped in sterile fashion.  Pressures taken with Dr. Anselm Pancoast at bedside.  Pressures 32/19 (24). Catheters and sheaths removed at 15:05.  Vpad used. Complete hemostasis achieved at 15:15.  Gauze and tegaderm applied and showed groin site to [place], Therapist, sports.  Barbarajean Kinzler RTR Cory Sandling RTR

## 2019-07-30 NOTE — Progress Notes (Signed)
[place] for Heparin Indication: pulmonary embolus  Allergies  Allergen Reactions  . Losartan Hives  . Codeine Anxiety  . Lisinopril Anxiety    Patient Measurements: Height: 6\' 2"  (188 cm) Weight: 204 lb 12.9 oz (92.9 kg) IBW/kg (Calculated) : 82.2 Heparin Dosing Weight: 99 kg  Vital Signs: Temp: 97.8 F (36.6 C) (08/14 1100) Temp Source: Oral (08/14 1100) BP: 126/83 (08/14 1200) Pulse Rate: 76 (08/14 1200)  Labs: Recent Labs    07/29/19 1650 07/29/19 1935  07/29/19 2223 07/30/19 0213 07/30/19 0215 07/30/19 0753 07/30/19 1109 07/30/19 1300  HGB 17.0  --   --   --   --  15.2 14.4  --   --   HCT 49.0  --   --   --   --  44.4 42.9  --   --   PLT 122*  --   --   --   --  100* 83*  --   --   APTT 28  --   --   --   --  58*  --  >200*  --   LABPROT 13.8  --   --   --   --   --   --   --   --   INR 1.1  --   --   --   --   --   --   --   --   HEPARINUNFRC  --   --    < >  --  0.15*  --   --  1.10* <0.10*  CREATININE 1.26*  --   --   --   --  1.06  --   --   --   TROPONINIHS 93* 116*  --  114*  --   --   --   --   --    < > = values in this interval not displayed.    Estimated Creatinine Clearance: 81.9 mL/min (by C-G formula based on SCr of 1.06 mg/dL).   Medical History: Past Medical History:  Diagnosis Date  . Allergic rhinitis   . Anxiety and depression   . BPH (benign prostatic hyperplasia)   . DVT (deep venous thrombosis) ([place])   . Elevated PSA   . GERD (gastroesophageal reflux disease)   . HLD (hyperlipidemia)   . HLD (hyperlipidemia)   . HTN (hypertension)   . Peyronie's disease   . Sleep apnea      Assessment: 63 year old male with h/o DVT on Xarelto PTA. Presents with progressive SOB and noted to be hypoxic on RA. Per med rec, last dose of Xarelto unknown. Patient also admits to recent non-compliance per EDP note. Patient with massive saddle PE per EDP, consult to dose heparin. EDP has also reached out to  vascular about possible catheter directed tPA. Discussed that protocol is to generally do tPA followed by heparin. Will start heparin drip now per EDP. Plan to f/u tPA recommendations from vascular.  No need for aPTT monitoring with Xarelto non-compliance  8/14 AM update:  Heparin level came back elevated at 1.1 and aPTT >200 - heparin held x1 hr and rechecked levels. Platelets trending down at 83 from 145. Fibrinogen normal. No bleeding noted.  Now Heparin level is undetectable, Fibrinogen within normal limits. Will resume at lower rate and recheck levels in 6 hours- discussed with RN Saralyn Pilar).  Goal of Therapy:  Heparin level 0.3-0.7 units/mL (goal stays the same with EKOS) Monitor platelets by  anticoagulation protocol: Yes   Plan:  Reduce Heparin to 1400 units/hr - restart now that levels are back down.  Checking heparin level every 6 hours while on EKOS. Monitor for signs and symptoms of bleeding.   Sloan Leiter, PharmD, BCPS, BCCCP Clinical Pharmacist Please refer to [facility] for [place] numbers 07/30/2019, 1:55 PM

## 2019-07-30 NOTE — Progress Notes (Signed)
  Echocardiogram 2D Echocardiogram has been performed.  Johny Chess 07/30/2019, 4:51 PM

## 2019-07-30 NOTE — Sedation Documentation (Signed)
Pt ART 54/18 (30)

## 2019-07-30 NOTE — Progress Notes (Signed)
NAME:  [facility]naway., MRN:  [id number], DOB:  02-19-1956, LOS: 1 ADMISSION DATE:  07/29/2019, CONSULTATION DATE:  07/29/2019 REFERRING MD:  [facility], CHIEF COMPLAINT:  PE   Brief History   63 yo male w/ hx of tobacco abuse, sleep apnea (on bipap) HTN, HLD, reccurrent DVT of the RLE (on Xarelto, off it for 6 months), previous treated prostate cancer in 2017, GERD, anxiety and depression  presenting with progressive SOB x 5 days, DOE, fatigue and mild epigastric pain found to be hypoxic with large large saddle PE with large clot burden and evidence of right heart strain on CTA with RV/ LV ratio of 2. Started on heparin and transferred to [facility] for further ICU monitoring and IR consult for possible EKOS.   Past Medical History  Smoker, sleep apnea, HTN, HLD, recurrent DVT of RLE with reported non-compliance with Xarelto, prostate cancer 2017, anxiety/ depression, GERD  Significant Hospital Events   8/31 tx from [facility] ED/ admit to [facility]  Consults:  IR  Procedures:   Significant Diagnostic Tests:  8/13 CTA PE >> 1. Examination is positive for bilateral pulmonary emboli with large thromboembolic burden including saddle embolus. Evidence of right heart strain with straightening of the intraventricular septum with RV to LV ratio of 2. 2. Left upper lobe/lingular perihilar consolidation with air bronchograms and slight surrounding ground-glass opacity. Findings are suspicious for pneumonia, however recommend radiographic follow-up after course of treatment to ensure resolution to exclude underlying malignancy. 3.  Aortic Atherosclerosis and Emphysema  BLE U/S 8/14 >> Right: There is no evidence of deep vein thrombosis in the lower extremity. No cystic structure found in the popliteal fossa. See technical findings listed above Left: Findings consistent with acute deep vein thrombosis involving the left popliteal vein, and left posterior tibial veins. No cystic structure found in the popliteal fossa  TTE 8/14 >>   Micro Data:  8/13 SARS CoV-2 >> negative  Antimicrobials:   Interim history/subjective:  No acute events overnight. O2 sats stable on 8L [place], drops to under 90s w/o [place]. He denies any SOB, lightheadedness, CP, leg pain or palpitations. Plan for EKOS today.  Objective   Blood pressure 104/67, pulse 77, temperature 98 F (36.7 C), temperature source Oral, resp. rate 17, height 6\' 2"  (1.88 m), weight 92.9 kg, SpO2 92 %.        Intake/Output Summary (Last 24 hours) at 07/30/2019 0959 Last data filed at 07/30/2019 0700 Gross per 24 hour  Intake 1495.03 ml  Output -  Net 1495.03 ml   Filed Weights   07/29/19 2200 07/30/19 0500  Weight: 91.9 kg 92.9 kg    Examination: General:  Adult male lying in bed in NAD on 8L N/C HEENT: MM pink/moist, pupils 4/reactive, no JVD CV: RRR, no murmur/rubs/gallops PULM: CTAB. No increased WOB. No wheezing GI: Soft, no guarding, BS active  Extremities: Warm/dry. RLE cool to the touch. No pitting edema Skin: No rashes, tan, minor bruises to arms Neuro: A/O x 4, moves all extremities  Resolved Hospital Problem list     Assessment & Plan:   Acute hypoxic respiratory failure/Saddle PE w/ cor pulmonale - Large clot burden and evidence of right heart strain with RV/LV ratio 2 - EKG with s1q3t3 pattern, +trop/BNP - Hemodynamically stable - On 8LNC w/ sats in upper 90s (sat goal >92%) - Continue IV heparin - IR consulted, plan for EKOS this afternoon - BLE US shows no DVT in RLE. Evidence of DVT in LLE involving the  left popliteal vein, and left posterior tibial veins.  - F/u TTE  Left upper lobe/ lingular consolidation. - Ground glass opacity and air bronchograms - Unlikely to be PNA w/ no leukocytosis or fever, normal procalcitonin - Hx of malignancy so consider w/u for lung malignancy  Mild AKI- previous sCr 0.9 (05/19/2019) - Resolved, Cr improved to 1.06 - Continue gentle IV hydration - Daily BMPs and I/Os - Replace  electrolytes as indicated  Mild elevated AST/ ALT with epigastric pain - Improved LFTs - Normal lipase - Monitor with daily CMP  OSA on bipap - Sats in upper 90s on 8LNC - Restart on BiPAP tonight s/p EKOS   Hx HTN - Baseline sBP in 130-140s - Soff BPs on admission, holding home norvasc  - Monitor  Hx HLD - Continue home Lipitor - Check lipid panel  Hx anxiety depression - Continue home zoloft  Hyperglycemia - Normal HgbA1c in June 2020 - Glucose 107 today  ETOH use - 6-7 beers /daily  - Monitor for signs of withdrawal - Daily thiamine and folate supplements  Best practice:  Diet: NPO Pain/Anxiety/Delirium protocol (if indicated): n/a VAP protocol (if indicated): n/a DVT prophylaxis: heparin IV GI prophylaxis: PPI for GERD Glucose control: add SSI if glucose >180 Mobility: BR Code Status: Full code Family Communication: patient updated on plan of care.  Daughter is Vernie Murders  Disposition: ICU    Labs   CBC: Recent Labs  Lab 07/29/19 1650 07/30/19 0215 07/30/19 0753  WBC 9.4 7.4 7.5  NEUTROABS 7.7  --   --   HGB 17.0 15.2 14.4  HCT 49.0 44.4 42.9  MCV 102.9* 104.7* 106.2*  PLT 122* 100* 83*    Basic Metabolic Panel: Recent Labs  Lab 07/29/19 1650 07/30/19 0215  NA 142 141  K 4.6 3.9  CL 107 110  CO2 23 22  GLUCOSE 164* 107*  BUN 13 9  CREATININE 1.26* 1.06  CALCIUM 8.9 8.1*   GFR: Estimated Creatinine Clearance: 81.9 mL/min (by C-G formula based on SCr of 1.06 mg/dL). Recent Labs  Lab 07/29/19 1650 07/29/19 2223 07/30/19 0215 07/30/19 0753  PROCALCITON  --  <0.10 <0.10  --   WBC 9.4  --  7.4 7.5    Liver Function Tests: Recent Labs  Lab 07/29/19 1650 07/30/19 0215  AST 49* 31  ALT 63* 49*  ALKPHOS 88 74  BILITOT 1.1 1.4*  PROT 6.6 5.5*  ALBUMIN 3.6 3.0*   Recent Labs  Lab 07/29/19 2223  LIPASE 32   No results for input(s): AMMONIA in the last 168 hours.  ABG    Component Value Date/Time   HCO3 26.6 07/29/2019  1658   O2SAT 47.1 07/29/2019 1658     Coagulation Profile: Recent Labs  Lab 07/29/19 1650  INR 1.1    Cardiac Enzymes: No results for input(s): CKTOTAL, CKMB, CKMBINDEX, TROPONINI in the last 168 hours.  HbA1C: No results found for: HGBA1C  CBG: Recent Labs  Lab 07/29/19 2156  GLUCAP 99      Critical care time: 40 mins

## 2019-07-30 NOTE — Progress Notes (Signed)
CRITICAL VALUE ALERT  Critical Value:  Troponin 114  Date & Time Notied:  07/30/19 0024  Provider Notified: Warren Lacy  Orders Received/Actions taken: Trending down- will continue to watch.   Milford Cage, RN

## 2019-07-30 NOTE — Progress Notes (Signed)
Bilateral lower extremity venous duplex completed. Preliminary results in Chart review CV Proc. [facility], [place] 10/30/2019 9:12 AM

## 2019-07-30 NOTE — Progress Notes (Signed)
[place] for Heparin Indication: pulmonary embolus  Allergies  Allergen Reactions  . Losartan Hives  . Codeine Anxiety  . Lisinopril Anxiety    Patient Measurements: Height: 6\' 2"  (188 cm) Weight: 202 lb 9.6 oz (91.9 kg) IBW/kg (Calculated) : 82.2 Heparin Dosing Weight: 99 kg  Vital Signs: Temp: 98.1 F (36.7 C) (08/13 2200) Temp Source: Axillary (08/13 2200) BP: 98/75 (08/14 0300) Pulse Rate: 84 (08/14 0300)  Labs: Recent Labs    07/29/19 1650 07/29/19 1935 07/29/19 2051 07/29/19 2223 07/30/19 0213 07/30/19 0215  HGB 17.0  --   --   --   --  15.2  HCT 49.0  --   --   --   --  44.4  PLT 122*  --   --   --   --  100*  APTT 28  --   --   --   --  58*  LABPROT 13.8  --   --   --   --   --   INR 1.1  --   --   --   --   --   HEPARINUNFRC  --   --  0.66  --  0.15*  --   CREATININE 1.26*  --   --   --   --   --   TROPONINIHS 93* 116*  --  114*  --   --     Estimated Creatinine Clearance: 68.9 mL/min (A) (by C-G formula based on SCr of 1.26 mg/dL (H)).   Medical History: Past Medical History:  Diagnosis Date  . Allergic rhinitis   . Anxiety and depression   . BPH (benign prostatic hyperplasia)   . DVT (deep venous thrombosis) ([place])   . Elevated PSA   . GERD (gastroesophageal reflux disease)   . HLD (hyperlipidemia)   . HLD (hyperlipidemia)   . HTN (hypertension)   . Peyronie's disease   . Sleep apnea      Assessment: 63 year old male with h/o DVT on Xarelto PTA. Presents with progressive SOB and noted to be hypoxic on RA. Per med rec, last dose of Xarelto unknown. Patient also admits to recent non-compliance per EDP note. Patient with massive saddle PE per EDP, consult to dose heparin. EDP has also reached out to vascular about possible catheter directed tPA. Discussed that protocol is to generally do tPA followed by heparin. Will start heparin drip now per EDP. Plan to f/u tPA recommendations from vascular.  No need  for aPTT monitoring with Xarelto non-compliance  8/14 AM update:  Heparin level is low, no issues per RN  Pt is now on EKOS with alteplase infusing in bilateral sites  Goal of Therapy:  Heparin level 0.3-0.7 units/mL (goal stays the same with EKOS) Monitor platelets by anticoagulation protocol: Yes   Plan:  Inc heparin to 1750 units/hr Checking heparin level every 6 hours while on [place], PharmD, [place] Pharmacist Phone: [phone]

## 2019-07-30 NOTE — Procedures (Signed)
Interventional Radiology Procedure Note  Procedure: Pulmonary arteriography with bilateral catheter directed pulmonary arterial thrombolysis  Complications: None  Estimated Blood Loss: < 10 mL  Findings: Main PA pressure = 54/18, mean 30 mm Hg Bilateral EKOS infusion catheters placed: 12 cm infusion in left PA and 18 cm in right PA extending back into main PA Will begin bilateral USAT at 1 mg/hr tPA infusions via each catheter x 12 hours then convert to saline. Recheck PAP tomorrow after infusions are completed.  Derrick Reyes. Kathlene Cote, M.D Pager:  [phone]

## 2019-07-30 NOTE — Progress Notes (Signed)
[place] for Heparin Indication: pulmonary embolus  Allergies  Allergen Reactions  . Losartan Hives  . Codeine Anxiety  . Lisinopril Anxiety    Patient Measurements: Height: 6\' 2"  (188 cm) Weight: 204 lb 12.9 oz (92.9 kg) IBW/kg (Calculated) : 82.2 Heparin Dosing Weight: 99 kg  Vital Signs: Temp: 97.9 F (36.6 C) (08/14 2001) Temp Source: Oral (08/14 2001) BP: 110/74 (08/14 1900) Pulse Rate: 74 (08/14 1900)  Labs: Recent Labs    07/29/19 1650 07/29/19 1935  07/29/19 2223  07/30/19 0215 07/30/19 0753 07/30/19 1109 07/30/19 1300 07/30/19 1523 07/30/19 1922  HGB 17.0  --   --   --   --  15.2 14.4  --   --  14.2 14.5  HCT 49.0  --   --   --   --  44.4 42.9  --   --  41.1 43.2  PLT 122*  --   --   --   --  100* 83*  --   --  78* 85*  APTT 28  --   --   --   --  58*  --  >200*  --   --   --   LABPROT 13.8  --   --   --   --   --   --   --   --   --   --   INR 1.1  --   --   --   --   --   --   --   --   --   --   HEPARINUNFRC  --   --    < >  --    < >  --   --  1.10* <0.10*  --  0.37  CREATININE 1.26*  --   --   --   --  1.06  --   --   --   --   --   TROPONINIHS 93* 116*  --  114*  --   --   --   --   --   --   --    < > = values in this interval not displayed.    Estimated Creatinine Clearance: 81.9 mL/min (by C-G formula based on SCr of 1.06 mg/dL).   Medical History: Past Medical History:  Diagnosis Date  . Allergic rhinitis   . Anxiety and depression   . BPH (benign prostatic hyperplasia)   . DVT (deep venous thrombosis) ([place])   . Elevated PSA   . GERD (gastroesophageal reflux disease)   . HLD (hyperlipidemia)   . HLD (hyperlipidemia)   . HTN (hypertension)   . Peyronie's disease   . Sleep apnea      Assessment: 63 year old male with h/o DVT on Xarelto PTA. Presents with progressive SOB and noted to be hypoxic on RA. Per med rec, last dose of Xarelto unknown. Patient also admits to recent non-compliance per  EDP note. Patient with massive saddle PE per EDP, consult to dose heparin.   No need for aPTT monitoring with Xarelto non-compliance Now s/p Ekos - TPA stopped Platelets 85  PM heparin level 0.37  Goal of Therapy:  Heparin level 0.3-0.7 units/mL (goal stays the same with EKOS) Monitor platelets by anticoagulation protocol: Yes   Plan:  Continue heparin at 1400 units / hr Next heparin level in AM at 4 am Monitor for signs and symptoms of bleeding.   Thank you Anette Guarneri, PharmD  Please refer to [facility] for [place] numbers 07/30/2019, 8:21 PM

## 2019-07-31 ENCOUNTER — Encounter (HOSPITAL_COMMUNITY): Payer: Self-pay

## 2019-07-31 ENCOUNTER — Inpatient Hospital Stay (HOSPITAL_COMMUNITY): Payer: Medicare Other

## 2019-07-31 LAB — CBC WITH DIFFERENTIAL/PLATELET
Abs Immature Granulocytes: 0.02 10*3/uL (ref 0.00–0.07)
Basophils Absolute: 0 10*3/uL (ref 0.0–0.1)
Basophils Relative: 0 %
Eosinophils Absolute: 0.1 10*3/uL (ref 0.0–0.5)
Eosinophils Relative: 2 %
HCT: 41.3 % (ref 39.0–52.0)
Hemoglobin: 13.8 g/dL (ref 13.0–17.0)
Immature Granulocytes: 0 %
Lymphocytes Relative: 11 %
Lymphs Abs: 0.7 10*3/uL (ref 0.7–4.0)
MCH: 35.5 pg — ABNORMAL HIGH (ref 26.0–34.0)
MCHC: 33.4 g/dL (ref 30.0–36.0)
MCV: 106.2 fL — ABNORMAL HIGH (ref 80.0–100.0)
Monocytes Absolute: 0.4 10*3/uL (ref 0.1–1.0)
Monocytes Relative: 6 %
Neutro Abs: 5.2 10*3/uL (ref 1.7–7.7)
Neutrophils Relative %: 81 %
Platelets: 83 10*3/uL — ABNORMAL LOW (ref 150–400)
RBC: 3.89 MIL/uL — ABNORMAL LOW (ref 4.22–5.81)
RDW: 12.9 % (ref 11.5–15.5)
WBC: 6.5 10*3/uL (ref 4.0–10.5)
nRBC: 0 % (ref 0.0–0.2)

## 2019-07-31 LAB — HEPATIC FUNCTION PANEL
ALT: 34 U/L (ref 0–44)
AST: 20 U/L (ref 15–41)
Albumin: 2.6 g/dL — ABNORMAL LOW (ref 3.5–5.0)
Alkaline Phosphatase: 66 U/L (ref 38–126)
Bilirubin, Direct: 0.2 mg/dL (ref 0.0–0.2)
Indirect Bilirubin: 0.5 mg/dL (ref 0.3–0.9)
Total Bilirubin: 0.7 mg/dL (ref 0.3–1.2)
Total Protein: 5.1 g/dL — ABNORMAL LOW (ref 6.5–8.1)

## 2019-07-31 LAB — BASIC METABOLIC PANEL
Anion gap: 7 (ref 5–15)
BUN: 10 mg/dL (ref 8–23)
CO2: 23 mmol/L (ref 22–32)
Calcium: 8.2 mg/dL — ABNORMAL LOW (ref 8.9–10.3)
Chloride: 110 mmol/L (ref 98–111)
Creatinine, Ser: 0.91 mg/dL (ref 0.61–1.24)
GFR calc Af Amer: >60 mL/min (ref >60)
GFR calc non Af Amer: >60 mL/min (ref >60)
Glucose, Bld: 103 mg/dL — ABNORMAL HIGH (ref 70–99)
Potassium: 3.3 mmol/L — ABNORMAL LOW (ref 3.5–5.1)
Sodium: 140 mmol/L (ref 135–145)

## 2019-07-31 LAB — LIPID PANEL
Cholesterol: 101 mg/dL (ref 0–200)
HDL: 27 mg/dL — ABNORMAL LOW (ref >40)
LDL Cholesterol: 55 mg/dL (ref 0–99)
Total CHOL/HDL Ratio: 3.7 RATIO
Triglycerides: 94 mg/dL (ref <150)
VLDL: 19 mg/dL (ref 0–40)

## 2019-07-31 LAB — HEPARIN LEVEL (UNFRACTIONATED)
Heparin Unfractionated: <0.1 IU/mL — ABNORMAL LOW (ref 0.30–0.70)
Heparin Unfractionated: 1.34 IU/mL — ABNORMAL HIGH (ref 0.30–0.70)
Heparin Unfractionated: 0.14 IU/mL — ABNORMAL LOW (ref 0.30–0.70)
Heparin Unfractionated: 0.24 IU/mL — ABNORMAL LOW (ref 0.30–0.70)

## 2019-07-31 LAB — LACTIC ACID, PLASMA: Lactic Acid, Venous: 0.8 mmol/L (ref 0.5–1.9)

## 2019-07-31 LAB — PROTIME-INR
INR: 1.2 (ref 0.8–1.2)
Prothrombin Time: 14.7 seconds (ref 11.4–15.2)

## 2019-07-31 LAB — MAGNESIUM: Magnesium: 1.8 mg/dL (ref 1.7–2.4)

## 2019-07-31 LAB — PHOSPHORUS: Phosphorus: 4.2 mg/dL (ref 2.5–4.6)

## 2019-07-31 MED ORDER — TRAMADOL HCL 50 MG PO TABS: 50.0000 mg | ORAL_TABLET | Freq: Four times a day (QID) | ORAL | Status: DC | PRN

## 2019-07-31 MED ORDER — VITAMIN B-1 100 MG PO TABS
100.0000 mg | ORAL_TABLET | Freq: Every day | ORAL | Status: DC
  Administered 2019-07-31 – 2019-08-02 (×3): 100 mg via ORAL
  Filled 2019-07-31 (×3): qty 1

## 2019-07-31 MED ORDER — LORATADINE 10 MG PO TABS
10.0000 mg | ORAL_TABLET | Freq: Every day | ORAL | Status: DC
  Administered 2019-07-31 – 2019-08-01 (×2): 10 mg via ORAL
  Filled 2019-07-31 (×3): qty 1

## 2019-07-31 MED ORDER — FOLIC ACID 1 MG PO TABS
1.0000 mg | ORAL_TABLET | Freq: Every day | ORAL | Status: DC
  Administered 2019-07-31 – 2019-08-02 (×3): 1 mg via ORAL
  Filled 2019-07-31 (×3): qty 1

## 2019-07-31 MED ORDER — LEVOCETIRIZINE DIHYDROCHLORIDE 5 MG PO TABS: 5.0000 mg | ORAL_TABLET | Freq: Every evening | ORAL | Status: DC

## 2019-07-31 MED ORDER — AMLODIPINE BESYLATE 5 MG PO TABS
5.0000 mg | ORAL_TABLET | Freq: Every day | ORAL | Status: DC
  Administered 2019-08-01 – 2019-08-02 (×2): 5 mg via ORAL
  Filled 2019-07-31 (×2): qty 1

## 2019-07-31 MED ORDER — POTASSIUM CHLORIDE CRYS ER 20 MEQ PO TBCR
40.0000 meq | EXTENDED_RELEASE_TABLET | Freq: Once | ORAL | Status: AC
  Administered 2019-07-31: 07:00:00 40 meq via ORAL
  Filled 2019-07-31: qty 2

## 2019-07-31 MED ORDER — AMLODIPINE BESYLATE 10 MG PO TABS: 10.0000 mg | ORAL_TABLET | Freq: Every day | ORAL | Status: DC

## 2019-07-31 NOTE — Progress Notes (Signed)
Referring Physician(s): Domingo Cocking  Supervising Physician: Markus Daft  Patient Status:  [facility] - In-pt  Chief Complaint:  Pulmonary emboli  Subjective: Patient feeling fairly good today; denies fever, chest pain, worsening dyspnea, cough   Allergies: Losartan, Codeine, and Lisinopril  Medications: Prior to Admission medications   Medication Sig Start Date End Date Taking? Authorizing Provider  amLODipine (NORVASC) 10 MG tablet Take 10 mg by mouth daily. 06/02/15  Yes [provider]  Cinnamon 500 MG capsule Take 500 mg by mouth daily.    Yes [provider]  levocetirizine (XYZAL) 5 MG tablet Take 5 mg by mouth every evening.   Yes [provider]  RA KRILL OIL 500 MG CAPS Take 500 mg by mouth daily.    Yes [provider]  sertraline (ZOLOFT) 100 MG tablet Take 150 mg by mouth daily.  06/02/15  Yes [provider]  triamcinolone (NASACORT) 55 MCG/ACT AERO nasal inhaler Place 1 spray into the nose daily as needed (congestion or allergies).    Yes [provider]  atorvastatin (LIPITOR) 10 MG tablet Take 10 mg by mouth daily at 6 PM.  06/29/15   [provider]  XARELTO 20 MG TABS tablet Take 20 mg by mouth daily with supper.  05/08/15   [provider]     Vital Signs: BP 116/70    Pulse 65    Temp 98 F (36.7 C) (Oral)    Resp 16    Ht 6\' 2"  (1.88 m)    Wt 203 lb 14.8 oz (92.5 kg)    SpO2 92%    BMI 26.18 kg/m   Physical Exam awake, alert.  Puncture site right common femoral vein clean, dry, soft, nontender, no hematoma; intact distal pulses  Imaging: Ct Angio Chest Pe W/cm &/or Wo Cm  Result Date: 07/29/2019 CLINICAL DATA:  Hypoxia and shortness of breath. Provided history of interstitial lung disease. EXAM: CT ANGIOGRAPHY CHEST WITH CONTRAST TECHNIQUE: Multidetector CT imaging of the chest was performed using the standard protocol during bolus administration of intravenous contrast. Multiplanar CT  image reconstructions and MIPs were obtained to evaluate the vascular anatomy. CONTRAST:  59mL OMNIPAQUE IOHEXOL 350 MG/ML SOLN COMPARISON:  None. FINDINGS: Cardiovascular: Examination is positive for bilateral pulmonary emboli with large thromboembolic burden. Moderate saddle embolus with filling defects extending into all lobes and segmental branches of both lungs. Evidence of right heart strain with straightening of the intraventricular septum and RV to LV ratio of 2. Contrast refluxes into the hepatic veins and IVC. Thoracic aorta is normal in caliber with mild atherosclerosis. No evidence of aortic dissection. Mediastinum/Nodes: No enlarged mediastinal lymph nodes. No definite hilar adenopathy. The esophagus is decompressed. No visualized thyroid nodule. Lungs/Pleura: Left upper lobe/lingular perihilar consolidation with air bronchograms in slight surrounding ground-glass opacity. No peripheral opacities to suggest pulmonary infarct. Minimal apically emphysema. Trachea and mainstem bronchi are patent. No pleural fluid. Upper Abdomen: Gallstone, partially included. No acute findings. Musculoskeletal: There are no acute or suspicious osseous abnormalities. Review of the MIP images confirms the above findings. IMPRESSION: 1. Examination is positive for bilateral pulmonary emboli with large thromboembolic burden including saddle embolus. Evidence of right heart strain with straightening of the intraventricular septum with RV to LV ratio of 2. The presence of right heart strain has been associated with an increased risk of morbidity and mortality. Please activate Code PE by paging [phone]. 2. Left upper lobe/lingular perihilar consolidation with air bronchograms and slight surrounding ground-glass opacity. Findings  are suspicious for pneumonia, however recommend radiographic follow-up after course of treatment to ensure resolution to exclude underlying malignancy. Aortic Atherosclerosis (ICD10-I70.0) and  Emphysema (ICD10-J43.9). Critical Value/emergent results were called by telephone at the time of interpretation on 07/29/2019 at 7:06 pm to Dr. Judson Roch MONKS , who verbally acknowledged these results. Electronically Signed   By: Keith Rake M.D.   On: 07/29/2019 19:11   Ir Angiogram Pulmonary Bilateral Selective  Result Date: 07/30/2019 INDICATION: Bilateral submassive pulmonary embolism with saddle thrombus, cor pulmonale and high oxygen requirement. Emergent catheter directed thrombolytic therapy performed to treat bilateral submassive pulmonary embolism. EXAM: 1. ULTRASOUND GUIDANCE FOR VASCULAR ACCESS OF THE RIGHT COMMON FEMORAL VEIN 2. BILATERAL SELECTIVE PULMONARY ARTERIOGRAPHY 3. SELECTIVE LEFT UPPER LOBE AND LOWER LOBE PULMONARY ARTERIOGRAPHY 4. SELECTIVE RIGHT LOWER LOBE PULMONARY ARTERIOGRAPHY 5. BILATERAL ULTRASOUND ASSISTED CATHETER DIRECTED PULMONARY ARTERIAL THROMBOLYTIC INFUSION THERAPY FOR PULMONARY EMBOLISM MEDICATIONS: TPA infusion.  See below. ANESTHESIA/SEDATION: Moderate (conscious) sedation was employed during this procedure. A total of Versed 1.0 mg and Fentanyl 50 mcg was administered intravenously. Moderate Sedation Time: 46 minutes. The patient's level of consciousness and vital signs were monitored continuously by radiology nursing throughout the procedure under my direct supervision. CONTRAST:  15 mL Omnipaque 300 FLUOROSCOPY TIME:  Fluoroscopy Time: 13 minutes and 36 seconds. 114.8 mGy. COMPLICATIONS: None immediate. PROCEDURE: Informed consent was obtained from the patient following explanation of the procedure, risks, benefits and alternatives. The patient understands, agrees and consents for the procedure. All questions were addressed. A time out was performed prior to the initiation of the procedure. Maximal barrier sterile technique utilized including caps, mask, sterile gowns, sterile gloves, large sterile drape, hand hygiene, and chlorhexidine prep. Ultrasound was used to  confirm patency of the right common femoral vein. Under direct ultrasound guidance, access of the vein was performed with 2 separate micropuncture sets with ultrasound image documentation performed. Over guidewires, two parallel 6 French sheaths were placed to secure access. A 5 French JB1 catheter was initially advanced and used to attempt catheterization of the pulmonary outflow tract. This was exchanged for a 5 French H1 catheter. This was advanced into the main pulmonary artery and pressure measurements obtained. The catheter was further advanced into the left pulmonary artery and arteriography performed. Additional selective arteriography was performed at the level of left upper lobe and lower lobe pulmonary arteries. A guidewire was advanced into the lower lobe pulmonary artery and maintained for access. The H1 catheter was then advanced over a guidewire in the second right femoral venous sheath and used to selectively catheterize the right pulmonary artery. Arteriography was performed followed by additional selective arteriography at the level of the right lower lobe pulmonary artery. A guidewire was advanced into the right lower lobe pulmonary artery and maintain for access. An EKOS infusion catheter with 12 cm infusion length was then advanced over the guidewire extending into the left lower lobe pulmonary artery. The coaxial ultrasound catheter was advanced and catheter position confirmed under fluoroscopy. A second EKOS infusion catheter with 18 cm infusion length was then advanced over the guidewire extending into the right lower lobe pulmonary artery. The coaxial ultrasound catheter was advanced and infusion catheter position confirmed by fluoroscopy. Both infusion catheters were attached to separate EKOS pump systems and thrombolytic infusion was begun at 1 mg of tPA per hour via each infusion catheter. Both sheaths and catheters systems were secured at the level of the right groin and thigh with a  Prolene retention suture and extensive overlying dressings.  FINDINGS: After catheterization of the main pulmonary artery, pressure measurements demonstrate moderate elevation of pulmonary artery pressures with main PA pressure of 54/18, mean 30 mm Hg. An infusion catheter was advanced into essentially occlusive thrombus in the left lower lobe pulmonary artery. A second infusion catheter was advanced into nearly occlusive thrombus in the right lower lobe pulmonary artery. Bilateral thrombolytic infusion therapy with EKOS ultrasound assisted catheters will be begun and continued for initial 12 hour infusions at 1 mg of tPA per hour via each catheter. IMPRESSION: Moderate elevation of main pulmonary artery pressure measuring 54-18, mean 30 mm Hg. Bilateral thrombolytic infusion therapy with EKOS ultrasound assisted catheters positioned in both lower lobe pulmonary arteries and extending back into the main pulmonary artery segments will be begun and continued for initial 12 hour infusions at 1 mg of tPA per hour via each catheter. Electronically Signed   By: Aletta Edouard M.D.   On: 07/30/2019 10:03   Ir Angiogram Selective Each Additional Vessel  Result Date: 07/30/2019 INDICATION: Bilateral submassive pulmonary embolism with saddle thrombus, cor pulmonale and high oxygen requirement. Emergent catheter directed thrombolytic therapy performed to treat bilateral submassive pulmonary embolism. EXAM: 1. ULTRASOUND GUIDANCE FOR VASCULAR ACCESS OF THE RIGHT COMMON FEMORAL VEIN 2. BILATERAL SELECTIVE PULMONARY ARTERIOGRAPHY 3. SELECTIVE LEFT UPPER LOBE AND LOWER LOBE PULMONARY ARTERIOGRAPHY 4. SELECTIVE RIGHT LOWER LOBE PULMONARY ARTERIOGRAPHY 5. BILATERAL ULTRASOUND ASSISTED CATHETER DIRECTED PULMONARY ARTERIAL THROMBOLYTIC INFUSION THERAPY FOR PULMONARY EMBOLISM MEDICATIONS: TPA infusion.  See below. ANESTHESIA/SEDATION: Moderate (conscious) sedation was employed during this procedure. A total of Versed 1.0 mg and  Fentanyl 50 mcg was administered intravenously. Moderate Sedation Time: 46 minutes. The patient's level of consciousness and vital signs were monitored continuously by radiology nursing throughout the procedure under my direct supervision. CONTRAST:  15 mL Omnipaque 300 FLUOROSCOPY TIME:  Fluoroscopy Time: 13 minutes and 36 seconds. 114.8 mGy. COMPLICATIONS: None immediate. PROCEDURE: Informed consent was obtained from the patient following explanation of the procedure, risks, benefits and alternatives. The patient understands, agrees and consents for the procedure. All questions were addressed. A time out was performed prior to the initiation of the procedure. Maximal barrier sterile technique utilized including caps, mask, sterile gowns, sterile gloves, large sterile drape, hand hygiene, and chlorhexidine prep. Ultrasound was used to confirm patency of the right common femoral vein. Under direct ultrasound guidance, access of the vein was performed with 2 separate micropuncture sets with ultrasound image documentation performed. Over guidewires, two parallel 6 French sheaths were placed to secure access. A 5 French JB1 catheter was initially advanced and used to attempt catheterization of the pulmonary outflow tract. This was exchanged for a 5 French H1 catheter. This was advanced into the main pulmonary artery and pressure measurements obtained. The catheter was further advanced into the left pulmonary artery and arteriography performed. Additional selective arteriography was performed at the level of left upper lobe and lower lobe pulmonary arteries. A guidewire was advanced into the lower lobe pulmonary artery and maintained for access. The H1 catheter was then advanced over a guidewire in the second right femoral venous sheath and used to selectively catheterize the right pulmonary artery. Arteriography was performed followed by additional selective arteriography at the level of the right lower lobe pulmonary  artery. A guidewire was advanced into the right lower lobe pulmonary artery and maintain for access. An EKOS infusion catheter with 12 cm infusion length was then advanced over the guidewire extending into the left lower lobe pulmonary artery. The coaxial ultrasound catheter  was advanced and catheter position confirmed under fluoroscopy. A second EKOS infusion catheter with 18 cm infusion length was then advanced over the guidewire extending into the right lower lobe pulmonary artery. The coaxial ultrasound catheter was advanced and infusion catheter position confirmed by fluoroscopy. Both infusion catheters were attached to separate EKOS pump systems and thrombolytic infusion was begun at 1 mg of tPA per hour via each infusion catheter. Both sheaths and catheters systems were secured at the level of the right groin and thigh with a Prolene retention suture and extensive overlying dressings. FINDINGS: After catheterization of the main pulmonary artery, pressure measurements demonstrate moderate elevation of pulmonary artery pressures with main PA pressure of 54/18, mean 30 mm Hg. An infusion catheter was advanced into essentially occlusive thrombus in the left lower lobe pulmonary artery. A second infusion catheter was advanced into nearly occlusive thrombus in the right lower lobe pulmonary artery. Bilateral thrombolytic infusion therapy with EKOS ultrasound assisted catheters will be begun and continued for initial 12 hour infusions at 1 mg of tPA per hour via each catheter. IMPRESSION: Moderate elevation of main pulmonary artery pressure measuring 54-18, mean 30 mm Hg. Bilateral thrombolytic infusion therapy with EKOS ultrasound assisted catheters positioned in both lower lobe pulmonary arteries and extending back into the main pulmonary artery segments will be begun and continued for initial 12 hour infusions at 1 mg of tPA per hour via each catheter. Electronically Signed   By: Aletta Edouard M.D.   On:  07/30/2019 10:03   Ir Angiogram Selective Each Additional Vessel  Result Date: 07/30/2019 INDICATION: Bilateral submassive pulmonary embolism with saddle thrombus, cor pulmonale and high oxygen requirement. Emergent catheter directed thrombolytic therapy performed to treat bilateral submassive pulmonary embolism. EXAM: 1. ULTRASOUND GUIDANCE FOR VASCULAR ACCESS OF THE RIGHT COMMON FEMORAL VEIN 2. BILATERAL SELECTIVE PULMONARY ARTERIOGRAPHY 3. SELECTIVE LEFT UPPER LOBE AND LOWER LOBE PULMONARY ARTERIOGRAPHY 4. SELECTIVE RIGHT LOWER LOBE PULMONARY ARTERIOGRAPHY 5. BILATERAL ULTRASOUND ASSISTED CATHETER DIRECTED PULMONARY ARTERIAL THROMBOLYTIC INFUSION THERAPY FOR PULMONARY EMBOLISM MEDICATIONS: TPA infusion.  See below. ANESTHESIA/SEDATION: Moderate (conscious) sedation was employed during this procedure. A total of Versed 1.0 mg and Fentanyl 50 mcg was administered intravenously. Moderate Sedation Time: 46 minutes. The patient's level of consciousness and vital signs were monitored continuously by radiology nursing throughout the procedure under my direct supervision. CONTRAST:  15 mL Omnipaque 300 FLUOROSCOPY TIME:  Fluoroscopy Time: 13 minutes and 36 seconds. 114.8 mGy. COMPLICATIONS: None immediate. PROCEDURE: Informed consent was obtained from the patient following explanation of the procedure, risks, benefits and alternatives. The patient understands, agrees and consents for the procedure. All questions were addressed. A time out was performed prior to the initiation of the procedure. Maximal barrier sterile technique utilized including caps, mask, sterile gowns, sterile gloves, large sterile drape, hand hygiene, and chlorhexidine prep. Ultrasound was used to confirm patency of the right common femoral vein. Under direct ultrasound guidance, access of the vein was performed with 2 separate micropuncture sets with ultrasound image documentation performed. Over guidewires, two parallel 6 French sheaths were  placed to secure access. A 5 French JB1 catheter was initially advanced and used to attempt catheterization of the pulmonary outflow tract. This was exchanged for a 5 French H1 catheter. This was advanced into the main pulmonary artery and pressure measurements obtained. The catheter was further advanced into the left pulmonary artery and arteriography performed. Additional selective arteriography was performed at the level of left upper lobe and lower lobe pulmonary arteries. A guidewire was  advanced into the lower lobe pulmonary artery and maintained for access. The H1 catheter was then advanced over a guidewire in the second right femoral venous sheath and used to selectively catheterize the right pulmonary artery. Arteriography was performed followed by additional selective arteriography at the level of the right lower lobe pulmonary artery. A guidewire was advanced into the right lower lobe pulmonary artery and maintain for access. An EKOS infusion catheter with 12 cm infusion length was then advanced over the guidewire extending into the left lower lobe pulmonary artery. The coaxial ultrasound catheter was advanced and catheter position confirmed under fluoroscopy. A second EKOS infusion catheter with 18 cm infusion length was then advanced over the guidewire extending into the right lower lobe pulmonary artery. The coaxial ultrasound catheter was advanced and infusion catheter position confirmed by fluoroscopy. Both infusion catheters were attached to separate EKOS pump systems and thrombolytic infusion was begun at 1 mg of tPA per hour via each infusion catheter. Both sheaths and catheters systems were secured at the level of the right groin and thigh with a Prolene retention suture and extensive overlying dressings. FINDINGS: After catheterization of the main pulmonary artery, pressure measurements demonstrate moderate elevation of pulmonary artery pressures with main PA pressure of 54/18, mean 30 mm Hg. An  infusion catheter was advanced into essentially occlusive thrombus in the left lower lobe pulmonary artery. A second infusion catheter was advanced into nearly occlusive thrombus in the right lower lobe pulmonary artery. Bilateral thrombolytic infusion therapy with EKOS ultrasound assisted catheters will be begun and continued for initial 12 hour infusions at 1 mg of tPA per hour via each catheter. IMPRESSION: Moderate elevation of main pulmonary artery pressure measuring 54-18, mean 30 mm Hg. Bilateral thrombolytic infusion therapy with EKOS ultrasound assisted catheters positioned in both lower lobe pulmonary arteries and extending back into the main pulmonary artery segments will be begun and continued for initial 12 hour infusions at 1 mg of tPA per hour via each catheter. Electronically Signed   By: Aletta Edouard M.D.   On: 07/30/2019 10:03   Ir US Guide Vasc Access Right  Result Date: 07/30/2019 INDICATION: Bilateral submassive pulmonary embolism with saddle thrombus, cor pulmonale and high oxygen requirement. Emergent catheter directed thrombolytic therapy performed to treat bilateral submassive pulmonary embolism. EXAM: 1. ULTRASOUND GUIDANCE FOR VASCULAR ACCESS OF THE RIGHT COMMON FEMORAL VEIN 2. BILATERAL SELECTIVE PULMONARY ARTERIOGRAPHY 3. SELECTIVE LEFT UPPER LOBE AND LOWER LOBE PULMONARY ARTERIOGRAPHY 4. SELECTIVE RIGHT LOWER LOBE PULMONARY ARTERIOGRAPHY 5. BILATERAL ULTRASOUND ASSISTED CATHETER DIRECTED PULMONARY ARTERIAL THROMBOLYTIC INFUSION THERAPY FOR PULMONARY EMBOLISM MEDICATIONS: TPA infusion.  See below. ANESTHESIA/SEDATION: Moderate (conscious) sedation was employed during this procedure. A total of Versed 1.0 mg and Fentanyl 50 mcg was administered intravenously. Moderate Sedation Time: 46 minutes. The patient's level of consciousness and vital signs were monitored continuously by radiology nursing throughout the procedure under my direct supervision. CONTRAST:  15 mL Omnipaque 300  FLUOROSCOPY TIME:  Fluoroscopy Time: 13 minutes and 36 seconds. 114.8 mGy. COMPLICATIONS: None immediate. PROCEDURE: Informed consent was obtained from the patient following explanation of the procedure, risks, benefits and alternatives. The patient understands, agrees and consents for the procedure. All questions were addressed. A time out was performed prior to the initiation of the procedure. Maximal barrier sterile technique utilized including caps, mask, sterile gowns, sterile gloves, large sterile drape, hand hygiene, and chlorhexidine prep. Ultrasound was used to confirm patency of the right common femoral vein. Under direct ultrasound guidance, access of the vein  was performed with 2 separate micropuncture sets with ultrasound image documentation performed. Over guidewires, two parallel 6 French sheaths were placed to secure access. A 5 French JB1 catheter was initially advanced and used to attempt catheterization of the pulmonary outflow tract. This was exchanged for a 5 French H1 catheter. This was advanced into the main pulmonary artery and pressure measurements obtained. The catheter was further advanced into the left pulmonary artery and arteriography performed. Additional selective arteriography was performed at the level of left upper lobe and lower lobe pulmonary arteries. A guidewire was advanced into the lower lobe pulmonary artery and maintained for access. The H1 catheter was then advanced over a guidewire in the second right femoral venous sheath and used to selectively catheterize the right pulmonary artery. Arteriography was performed followed by additional selective arteriography at the level of the right lower lobe pulmonary artery. A guidewire was advanced into the right lower lobe pulmonary artery and maintain for access. An EKOS infusion catheter with 12 cm infusion length was then advanced over the guidewire extending into the left lower lobe pulmonary artery. The coaxial ultrasound  catheter was advanced and catheter position confirmed under fluoroscopy. A second EKOS infusion catheter with 18 cm infusion length was then advanced over the guidewire extending into the right lower lobe pulmonary artery. The coaxial ultrasound catheter was advanced and infusion catheter position confirmed by fluoroscopy. Both infusion catheters were attached to separate EKOS pump systems and thrombolytic infusion was begun at 1 mg of tPA per hour via each infusion catheter. Both sheaths and catheters systems were secured at the level of the right groin and thigh with a Prolene retention suture and extensive overlying dressings. FINDINGS: After catheterization of the main pulmonary artery, pressure measurements demonstrate moderate elevation of pulmonary artery pressures with main PA pressure of 54/18, mean 30 mm Hg. An infusion catheter was advanced into essentially occlusive thrombus in the left lower lobe pulmonary artery. A second infusion catheter was advanced into nearly occlusive thrombus in the right lower lobe pulmonary artery. Bilateral thrombolytic infusion therapy with EKOS ultrasound assisted catheters will be begun and continued for initial 12 hour infusions at 1 mg of tPA per hour via each catheter. IMPRESSION: Moderate elevation of main pulmonary artery pressure measuring 54-18, mean 30 mm Hg. Bilateral thrombolytic infusion therapy with EKOS ultrasound assisted catheters positioned in both lower lobe pulmonary arteries and extending back into the main pulmonary artery segments will be begun and continued for initial 12 hour infusions at 1 mg of tPA per hour via each catheter. Electronically Signed   By: Aletta Edouard M.D.   On: 07/30/2019 10:03   Dg Chest Port 1 View  Result Date: 07/31/2019 CLINICAL DATA:  Hypoxia EXAM: PORTABLE CHEST 1 VIEW COMPARISON:  Chest CT from 2 days ago FINDINGS: Airspace opacity on the left is not well visualized. Lung volumes are low and there is interstitial  crowding. No Kerley lines, effusion, or pneumothorax. Borderline heart size for technique. IMPRESSION: Low volume chest with vascular crowding. Left upper lobe airspace disease on recent chest CT is not well demonstrated. Electronically Signed   By: Monte Fantasia M.D.   On: 07/31/2019 09:36   Vas [place] Lower Extremity Venous (dvt)  Result Date: 07/30/2019  Lower Venous Study Indications: Pulmonary embolism.  Risk Factors: Confirmed PE. Comparison Study: 07/17/14 RT pop and ptv, resolved on study of 08/10/2014 No left  for comparison Performing Technologist: Toma Copier RVS  Examination Guidelines: A complete evaluation includes B-mode imaging, spectral Doppler, color Doppler, and power Doppler as needed of all accessible portions of each vessel. Bilateral testing is considered an integral part of a complete examination. Limited examinations for reoccurring indications may be performed as noted.  +---------+---------------+---------+-----------+----------+-------+  RIGHT     Compressibility Phasicity Spontaneity Properties Summary  +---------+---------------+---------+-----------+----------+-------+  CFV       Full            Yes       Yes                             +---------+---------------+---------+-----------+----------+-------+  SFJ       Full                                                      +---------+---------------+---------+-----------+----------+-------+  FV Prox   Full            Yes       Yes                             +---------+---------------+---------+-----------+----------+-------+  FV Mid    Full                                                      +---------+---------------+---------+-----------+----------+-------+  FV Distal Full            Yes       Yes                             +---------+---------------+---------+-----------+----------+-------+  PFV       Full            Yes       Yes                              +---------+---------------+---------+-----------+----------+-------+  POP       Full            Yes       Yes                             +---------+---------------+---------+-----------+----------+-------+  PTV       Full                                                      +---------+---------------+---------+-----------+----------+-------+  PERO      Full                                                      +---------+---------------+---------+-----------+----------+-------+   Right Technical Findings:  Difficult to image due to femoral sheath and bandage. Unable to rotate the leg for optimal imaging  +---------+---------------+---------+-----------+----------+-------------------+  LEFT      Compressibility Phasicity Spontaneity Properties Summary              +---------+---------------+---------+-----------+----------+-------------------+  CFV       Full            Yes       Yes                                         +---------+---------------+---------+-----------+----------+-------------------+  SFJ       Full                                                                  +---------+---------------+---------+-----------+----------+-------------------+  FV Prox   Full            Yes       Yes                                         +---------+---------------+---------+-----------+----------+-------------------+  FV Mid    Full                                                                  +---------+---------------+---------+-----------+----------+-------------------+  FV Distal Full            Yes       Yes                                         +---------+---------------+---------+-----------+----------+-------------------+  PFV       Full            Yes       Yes                                         +---------+---------------+---------+-----------+----------+-------------------+  POP       None            No        No                     Acute                 +---------+---------------+---------+-----------+----------+-------------------+  PTV       Partial                                          Acute in one of the  paired veins                                                                     proximally           +---------+---------------+---------+-----------+----------+-------------------+  PERO                                                       Not visualized       +---------+---------------+---------+-----------+----------+-------------------+   Left Technical Findings: Minute flow noted in a small region of the popliteal.   Summary: Right: There is no evidence of deep vein thrombosis in the lower extremity. No cystic structure found in the popliteal fossa. See technical findings listed above Left: Findings consistent with acute deep vein thrombosis involving the left popliteal vein, and left posterior tibial veins. No cystic structure found in the popliteal fossa. See technical findings listed above  *See table(s) above for measurements and observations.    Preliminary    Ir Infusion Thrombol Arterial Initial (ms)  Result Date: 07/30/2019 INDICATION: Bilateral submassive pulmonary embolism with saddle thrombus, cor pulmonale and high oxygen requirement. Emergent catheter directed thrombolytic therapy performed to treat bilateral submassive pulmonary embolism. EXAM: 1. ULTRASOUND GUIDANCE FOR VASCULAR ACCESS OF THE RIGHT COMMON FEMORAL VEIN 2. BILATERAL SELECTIVE PULMONARY ARTERIOGRAPHY 3. SELECTIVE LEFT UPPER LOBE AND LOWER LOBE PULMONARY ARTERIOGRAPHY 4. SELECTIVE RIGHT LOWER LOBE PULMONARY ARTERIOGRAPHY 5. BILATERAL ULTRASOUND ASSISTED CATHETER DIRECTED PULMONARY ARTERIAL THROMBOLYTIC INFUSION THERAPY FOR PULMONARY EMBOLISM MEDICATIONS: TPA infusion.  See below. ANESTHESIA/SEDATION: Moderate (conscious) sedation was employed during this procedure. A total of Versed 1.0 mg and Fentanyl 50 mcg  was administered intravenously. Moderate Sedation Time: 46 minutes. The patient's level of consciousness and vital signs were monitored continuously by radiology nursing throughout the procedure under my direct supervision. CONTRAST:  15 mL Omnipaque 300 FLUOROSCOPY TIME:  Fluoroscopy Time: 13 minutes and 36 seconds. 114.8 mGy. COMPLICATIONS: None immediate. PROCEDURE: Informed consent was obtained from the patient following explanation of the procedure, risks, benefits and alternatives. The patient understands, agrees and consents for the procedure. All questions were addressed. A time out was performed prior to the initiation of the procedure. Maximal barrier sterile technique utilized including caps, mask, sterile gowns, sterile gloves, large sterile drape, hand hygiene, and chlorhexidine prep. Ultrasound was used to confirm patency of the right common femoral vein. Under direct ultrasound guidance, access of the vein was performed with 2 separate micropuncture sets with ultrasound image documentation performed. Over guidewires, two parallel 6 French sheaths were placed to secure access. A 5 French JB1 catheter was initially advanced and used to attempt catheterization of the pulmonary outflow tract. This was exchanged for a 5 French H1 catheter. This was advanced into the main pulmonary artery and pressure measurements obtained. The catheter was further advanced into the left pulmonary artery and arteriography performed. Additional selective arteriography was performed at the level of left upper lobe and lower lobe pulmonary arteries. A guidewire was advanced into the lower lobe pulmonary artery and maintained for access. The H1 catheter was then advanced over a guidewire in the second right  femoral venous sheath and used to selectively catheterize the right pulmonary artery. Arteriography was performed followed by additional selective arteriography at the level of the right lower lobe pulmonary artery. A  guidewire was advanced into the right lower lobe pulmonary artery and maintain for access. An EKOS infusion catheter with 12 cm infusion length was then advanced over the guidewire extending into the left lower lobe pulmonary artery. The coaxial ultrasound catheter was advanced and catheter position confirmed under fluoroscopy. A second EKOS infusion catheter with 18 cm infusion length was then advanced over the guidewire extending into the right lower lobe pulmonary artery. The coaxial ultrasound catheter was advanced and infusion catheter position confirmed by fluoroscopy. Both infusion catheters were attached to separate EKOS pump systems and thrombolytic infusion was begun at 1 mg of tPA per hour via each infusion catheter. Both sheaths and catheters systems were secured at the level of the right groin and thigh with a Prolene retention suture and extensive overlying dressings. FINDINGS: After catheterization of the main pulmonary artery, pressure measurements demonstrate moderate elevation of pulmonary artery pressures with main PA pressure of 54/18, mean 30 mm Hg. An infusion catheter was advanced into essentially occlusive thrombus in the left lower lobe pulmonary artery. A second infusion catheter was advanced into nearly occlusive thrombus in the right lower lobe pulmonary artery. Bilateral thrombolytic infusion therapy with EKOS ultrasound assisted catheters will be begun and continued for initial 12 hour infusions at 1 mg of tPA per hour via each catheter. IMPRESSION: Moderate elevation of main pulmonary artery pressure measuring 54-18, mean 30 mm Hg. Bilateral thrombolytic infusion therapy with EKOS ultrasound assisted catheters positioned in both lower lobe pulmonary arteries and extending back into the main pulmonary artery segments will be begun and continued for initial 12 hour infusions at 1 mg of tPA per hour via each catheter. Electronically Signed   By: Aletta Edouard M.D.   On: 07/30/2019 10:03    Ir Infusion Thrombol Arterial Initial (ms)  Result Date: 07/30/2019 INDICATION: Bilateral submassive pulmonary embolism with saddle thrombus, cor pulmonale and high oxygen requirement. Emergent catheter directed thrombolytic therapy performed to treat bilateral submassive pulmonary embolism. EXAM: 1. ULTRASOUND GUIDANCE FOR VASCULAR ACCESS OF THE RIGHT COMMON FEMORAL VEIN 2. BILATERAL SELECTIVE PULMONARY ARTERIOGRAPHY 3. SELECTIVE LEFT UPPER LOBE AND LOWER LOBE PULMONARY ARTERIOGRAPHY 4. SELECTIVE RIGHT LOWER LOBE PULMONARY ARTERIOGRAPHY 5. BILATERAL ULTRASOUND ASSISTED CATHETER DIRECTED PULMONARY ARTERIAL THROMBOLYTIC INFUSION THERAPY FOR PULMONARY EMBOLISM MEDICATIONS: TPA infusion.  See below. ANESTHESIA/SEDATION: Moderate (conscious) sedation was employed during this procedure. A total of Versed 1.0 mg and Fentanyl 50 mcg was administered intravenously. Moderate Sedation Time: 46 minutes. The patient's level of consciousness and vital signs were monitored continuously by radiology nursing throughout the procedure under my direct supervision. CONTRAST:  15 mL Omnipaque 300 FLUOROSCOPY TIME:  Fluoroscopy Time: 13 minutes and 36 seconds. 114.8 mGy. COMPLICATIONS: None immediate. PROCEDURE: Informed consent was obtained from the patient following explanation of the procedure, risks, benefits and alternatives. The patient understands, agrees and consents for the procedure. All questions were addressed. A time out was performed prior to the initiation of the procedure. Maximal barrier sterile technique utilized including caps, mask, sterile gowns, sterile gloves, large sterile drape, hand hygiene, and chlorhexidine prep. Ultrasound was used to confirm patency of the right common femoral vein. Under direct ultrasound guidance, access of the vein was performed with 2 separate micropuncture sets with ultrasound image documentation performed. Over guidewires, two parallel 6 French sheaths were placed to secure  access. A 5 French JB1 catheter was initially advanced and used to attempt catheterization of the pulmonary outflow tract. This was exchanged for a 5 French H1 catheter. This was advanced into the main pulmonary artery and pressure measurements obtained. The catheter was further advanced into the left pulmonary artery and arteriography performed. Additional selective arteriography was performed at the level of left upper lobe and lower lobe pulmonary arteries. A guidewire was advanced into the lower lobe pulmonary artery and maintained for access. The H1 catheter was then advanced over a guidewire in the second right femoral venous sheath and used to selectively catheterize the right pulmonary artery. Arteriography was performed followed by additional selective arteriography at the level of the right lower lobe pulmonary artery. A guidewire was advanced into the right lower lobe pulmonary artery and maintain for access. An EKOS infusion catheter with 12 cm infusion length was then advanced over the guidewire extending into the left lower lobe pulmonary artery. The coaxial ultrasound catheter was advanced and catheter position confirmed under fluoroscopy. A second EKOS infusion catheter with 18 cm infusion length was then advanced over the guidewire extending into the right lower lobe pulmonary artery. The coaxial ultrasound catheter was advanced and infusion catheter position confirmed by fluoroscopy. Both infusion catheters were attached to separate EKOS pump systems and thrombolytic infusion was begun at 1 mg of tPA per hour via each infusion catheter. Both sheaths and catheters systems were secured at the level of the right groin and thigh with a Prolene retention suture and extensive overlying dressings. FINDINGS: After catheterization of the main pulmonary artery, pressure measurements demonstrate moderate elevation of pulmonary artery pressures with main PA pressure of 54/18, mean 30 mm Hg. An infusion  catheter was advanced into essentially occlusive thrombus in the left lower lobe pulmonary artery. A second infusion catheter was advanced into nearly occlusive thrombus in the right lower lobe pulmonary artery. Bilateral thrombolytic infusion therapy with EKOS ultrasound assisted catheters will be begun and continued for initial 12 hour infusions at 1 mg of tPA per hour via each catheter. IMPRESSION: Moderate elevation of main pulmonary artery pressure measuring 54-18, mean 30 mm Hg. Bilateral thrombolytic infusion therapy with EKOS ultrasound assisted catheters positioned in both lower lobe pulmonary arteries and extending back into the main pulmonary artery segments will be begun and continued for initial 12 hour infusions at 1 mg of tPA per hour via each catheter. Electronically Signed   By: Aletta Edouard M.D.   On: 07/30/2019 10:03   Ir Jacolyn Reedy F/u Elizabeth Sauer Art/ven Final Day (ms)  Result Date: 07/30/2019 INDICATION: 63 year old with bilateral submassive pulmonary embolism with saddle embolism. Patient underwent ultrasound assisted catheter directed pulmonary arterial thrombolytics infusion therapy for 24 hours. The thrombolytics infusion was completed. EXAM: PULMONARY ARTERY PRESSURES FOLLOWING CATHETER DIRECTED THROMBOLYTIC THERAPY COMPARISON:  Catheter placement study 07/30/2019 MEDICATIONS: None. ANESTHESIA/SEDATION: None FLUOROSCOPY TIME:  None COMPLICATIONS: None immediate. TECHNIQUE: Patient has two vascular sheaths in the right groin. The ultrasound core transducers were removed from both catheters. Catheters were pulled back approximately 8-10 cm and pressures were obtained. Both infusion catheters were removed. Subsequently, both vascular sheath removed with manual compression. FINDINGS: Post thrombolysis pressure in the main pulmonary artery is 32/19, mean pressure 24 mmHg. IMPRESSION: 1. Successful completion of the ultrasound assisted catheter directed thrombolytic therapy for pulmonary emboli. 2.  Main pulmonary artery pressure has decreased from 30 to 24 mmHg after the catheter directed thrombolysis. Electronically Signed   By: Markus Daft M.D.   On:  07/30/2019 17:01    Labs:  CBC: Recent Labs    07/30/19 0753 07/30/19 1523 07/30/19 1922 07/31/19 0401  WBC 7.5 6.2 7.3 6.5  HGB 14.4 14.2 14.5 13.8  HCT 42.9 41.1 43.2 41.3  PLT 83* 78* 85* 83*    COAGS: Recent Labs    07/29/19 1650 07/30/19 0215 07/30/19 1109 07/31/19 0401  INR 1.1  --   --  1.2  APTT 28 58* >200*  --     BMP: Recent Labs    07/29/19 1650 07/30/19 0215 07/31/19 0100  NA 142 141 140  K 4.6 3.9 3.3*  CL 107 110 110  CO2 23 22 23   GLUCOSE 164* 107* 103*  BUN 13 9 10   CALCIUM 8.9 8.1* 8.2*  CREATININE 1.26* 1.06 0.91  GFRNONAA 60* >60 >60  GFRAA >60 >60 >60    LIVER FUNCTION TESTS: Recent Labs    07/29/19 1650 07/30/19 0215 07/31/19 0401  BILITOT 1.1 1.4* 0.7  AST 49* 31 20  ALT 63* 49* 34  ALKPHOS 88 74 66  PROT 6.6 5.5* 5.1*  ALBUMIN 3.6 3.0* 2.6*    Assessment and Plan: Patient with history of bilateral submassive PE, left lower extremity DVT; status post ultrasound assisted catheter directed PE thrombolysis completed 8/14; afebrile, WBC normal, hemoglobin 13.8, platelet 83k, creatinine 0.9; no abnormal bleeding noted, currently on IV heparin with planned transition to oral anticoagulation tomorrow per CCM if plts stable/improving   Electronically Signed: D. Rowe [place]pa, PA-C 07/31/2019, 2:31 PM   I spent a total of 15 minutes at the the patient's bedside AND on the patient's hospital floor or unit, greater than 50% of which was counseling/coordinating care for thrombolytic therapy of pulmonary emboli    Patient ID: Derrick Reyes., male   DOB: 02-Feb-1956, 63 y.o.   MRN: [id number]

## 2019-07-31 NOTE — Progress Notes (Signed)
ANTICOAGULATION CONSULT NOTE - Follow-Up  Pharmacy Consult for Heparin Indication: pulmonary embolus  Patient Measurements: Height: 6\' 2"  (188 cm) Weight: 211 lb 12.8 oz (96.1 kg) IBW/kg (Calculated) : 82.2 Heparin Dosing Weight: 99 kg  Vital Signs: Temp: 98.2 F (36.8 C) (08/15 2101) Temp Source: Oral (08/15 2101) BP: 124/69 (08/15 2101) Pulse Rate: 66 (08/15 2101)  Labs: Recent Labs    07/29/19 1650 07/29/19 1935  07/29/19 2223  07/30/19 0215  07/30/19 1109  07/30/19 1523 07/30/19 1922 07/31/19 0100 07/31/19 0401 07/31/19 1301 07/31/19 1446 07/31/19 2142  HGB 17.0  --   --   --   --  15.2   < >  --   --  14.2 14.5  --  13.8  --   --   --   HCT 49.0  --   --   --   --  44.4   < >  --   --  41.1 43.2  --  41.3  --   --   --   PLT 122*  --   --   --   --  100*   < >  --   --  78* 85*  --  83*  --   --   --   APTT 28  --   --   --   --  58*  --  >200*  --   --   --   --   --   --   --   --   LABPROT 13.8  --   --   --   --   --   --   --   --   --   --   --  14.7  --   --   --   INR 1.1  --   --   --   --   --   --   --   --   --   --   --  1.2  --   --   --   HEPARINUNFRC  --   --    < >  --    < >  --   --  1.10*   < >  --  0.37  --  <0.10* 1.34* 0.14* 0.24*  CREATININE 1.26*  --   --   --   --  1.06  --   --   --   --   --  0.91  --   --   --   --   TROPONINIHS 93* 116*  --  114*  --   --   --   --   --   --   --   --   --   --   --   --    < > = values in this interval not displayed.    Estimated Creatinine Clearance: 95.3 mL/min (by C-G formula based on SCr of 0.91 mg/dL).   Assessment: 65 YOM who presented on 8/13 with massive acute B/L saddle PE in the setting of Xarelto non-compliance for hx DVT. The patient is s/p EKOS 8/14. Pharmacy consulted for anticoagulation with heparin.   Repeat heparin level remains below goal, no issues with infusion per RN.   Goal of Therapy:  Heparin level 0.3-0.7 units/mL (goal stays the same with EKOS) Monitor platelets by  anticoagulation protocol: Yes   Plan:  -Increase heparin to 2100 units/hr -Recheck heparin level at 0500  Arrie Senate, PharmD, BCPS Clinical Pharmacist Please check AMION for all [facility] numbers 07/31/2019

## 2019-07-31 NOTE — Progress Notes (Signed)
PCCM Progress Note  Name: Derrick Reyes. DOB: 02-05-1956 MRN: [id number] Admission date: 07/29/2019 LOS: 2 CC: Short of breath  Brief History: 63 yo male smoker with hx of DVT and taken himself off xarelto presented to urgent care with dyspnea and hypoxia.  Found to have saddle PE with b/l extension, Lt upper and lingular lobe pulmonary infarcts, RV:LV ratio 2, and Lt leg DVT.  IR consulted and treated with EKOS in addition to heparin gtt.  Subjective: Breathing much better.  Denies chest pain.  Has chronic joint pains from osteoarthritis.  Vital signs: BP (!) 142/84   Pulse 67   Temp 98 F (36.7 C) (Oral)   Resp 18   Ht 6\' 2"  (1.88 m)   Wt 92.5 kg   SpO2 94%   BMI 26.18 kg/m   Physical exam: General - alert Eyes - pupils reactive ENT - no sinus tenderness, no stridor Cardiac - regular rate/rhythm, no murmur Chest - equal breath sounds b/l, no wheezing or rales Abdomen - soft, non tender, + bowel sounds Extremities - no cyanosis, clubbing, or edema Skin - no rashes Neuro - normal strength, moves extremities, follows commands Lymphatics - no lymphadenopathy Psych - normal mood and behavior GU - no lesions noted  Labs: CMP Latest Ref Rng & Units 07/31/2019 07/30/2019 07/29/2019  Glucose 70 - 99 mg/dL 103(H) 107(H) 164(H)  BUN 8 - 23 mg/dL 10 9 13   Creatinine 0.61 - 1.24 mg/dL 0.91 1.06 1.26(H)  Sodium 135 - 145 mmol/L 140 141 142  Potassium 3.5 - 5.1 mmol/L 3.3(L) 3.9 4.6  Chloride 98 - 111 mmol/L 110 110 107  CO2 22 - 32 mmol/L 23 22 23   Calcium 8.9 - 10.3 mg/dL 8.2(L) 8.1(L) 8.9  Total Protein 6.5 - 8.1 g/dL 5.1(L) 5.5(L) 6.6  Total Bilirubin 0.3 - 1.2 mg/dL 0.7 1.4(H) 1.1  Alkaline Phos 38 - 126 U/L 66 74 88  AST 15 - 41 U/L 20 31 49(H)  ALT 0 - 44 U/L 34 49(H) 63(H)    CBC Latest Ref Rng & Units 07/31/2019 07/30/2019 07/30/2019  WBC 4.0 - 10.5 K/uL 6.5 7.3 6.2  Hemoglobin 13.0 - 17.0 g/dL 13.8 14.5 14.2  Hematocrit 39.0 - 52.0 % 41.3 43.2 41.1  Platelets  150 - 400 K/uL 83(L) 85(L) 78(L)    Lab Results  Component Value Date   INR 1.2 07/31/2019   INR 1.1 07/29/2019    Studies: CT angio chest 8/13 >> saddle PE with b/l extension, Lt upper and lingular lobe pulmonary infarcts, RV:LV ratio 2, emphysema, atherosclerosis Doppler legs b/l 8/14 >> acute DVT Lt popliteal and posterior tibial veins Echo 8/14 >> EF 55 to 49%, mod RV systolic dysfunction, RVSP 44 mmHg  Assessment/plan:  Acute pulmonary embolism with cor pulmonale and Lt upper lobe and lingular pulmonary infarct. Acute Lt popliteal and tibial vein DVTs. Hx of DVT in Rt leg. - completed EKOS - continue heparin gtt for now - if PLT stable to improving, then transition to oral anticoagulation in next 24 to 48 hrs - f/u CXR in couple weeks to monitor for improvement of pulmonary infarct - will need life long anticoagulation - continue IV fluids  Tobacco abuse with changes of emphysema on CT chest. - will need PFT as outpt in several weeks to assess for COPD - smoking cessation  Hx of OSA. - CPAP qhs  Hx of HTN, HLD. - resume norvasc - continue lipitor  Hx of anxiety, depression. - zoloft  Osteoarthritis. -  prn tylenol, tramadol  Hypokalemia. - replace as needed  Thrombocytopenia. - f/u CBC  Transfer to telemetry 8/15 >> to [facility] 8/16 and PCCM off  Chesley Mires, MD [place] 07/31/2019, 8:59 AM

## 2019-07-31 NOTE — Progress Notes (Signed)
[place] for Heparin Indication: pulmonary embolus  Allergies  Allergen Reactions  . Losartan Hives  . Codeine Anxiety  . Lisinopril Anxiety    Patient Measurements: Height: 6\' 2"  (188 cm) Weight: 204 lb 12.9 oz (92.9 kg) IBW/kg (Calculated) : 82.2 Heparin Dosing Weight: 99 kg  Vital Signs: Temp: 98 F (36.7 C) (08/15 0421) Temp Source: Oral (08/15 0421) BP: 137/82 (08/15 0422) Pulse Rate: 60 (08/15 0422)  Labs: Recent Labs    07/29/19 1650 07/29/19 1935  07/29/19 2223  07/30/19 0215  07/30/19 1109 07/30/19 1300 07/30/19 1523 07/30/19 1922 07/31/19 0100 07/31/19 0401  HGB 17.0  --   --   --   --  15.2   < >  --   --  14.2 14.5  --  13.8  HCT 49.0  --   --   --   --  44.4   < >  --   --  41.1 43.2  --  41.3  PLT 122*  --   --   --   --  100*   < >  --   --  78* 85*  --  83*  APTT 28  --   --   --   --  58*  --  >200*  --   --   --   --   --   LABPROT 13.8  --   --   --   --   --   --   --   --   --   --   --  14.7  INR 1.1  --   --   --   --   --   --   --   --   --   --   --  1.2  HEPARINUNFRC  --   --    < >  --    < >  --   --  1.10* <0.10*  --  0.37  --  <0.10*  CREATININE 1.26*  --   --   --   --  1.06  --   --   --   --   --  0.91  --   TROPONINIHS 93* 116*  --  114*  --   --   --   --   --   --   --   --   --    < > = values in this interval not displayed.    Estimated Creatinine Clearance: 95.3 mL/min (by C-G formula based on SCr of 0.91 mg/dL).   Medical History: Past Medical History:  Diagnosis Date  . Allergic rhinitis   . Anxiety and depression   . BPH (benign prostatic hyperplasia)   . DVT (deep venous thrombosis) ([place])   . Elevated PSA   . GERD (gastroesophageal reflux disease)   . HLD (hyperlipidemia)   . HLD (hyperlipidemia)   . HTN (hypertension)   . Peyronie's disease   . Sleep apnea      Assessment: 63 year old male with h/o DVT on Xarelto PTA. Presents with progressive SOB and noted to be  hypoxic on RA. Per med rec, last dose of Xarelto unknown. Patient also admits to recent non-compliance per EDP note. Patient with massive saddle PE per EDP, consult to dose heparin. EDP has also reached out to vascular about possible catheter directed tPA. Discussed that protocol is to generally do tPA followed by heparin. Will start heparin  drip now per EDP. Plan to f/u tPA recommendations from vascular.  No need for aPTT monitoring with Xarelto non-compliance  8/15 AM update:  Heparin level undetectable  H/H stable, Plts still low but stable   Finished with EKOS  Goal of Therapy:  Heparin level 0.3-0.7 units/mL Monitor platelets by anticoagulation protocol: Yes   Plan:  Inc heparin to 1600 units/hr 1300 HL  Narda Bonds, PharmD, BCPS Clinical Pharmacist Phone: [phone]

## 2019-07-31 NOTE — Progress Notes (Signed)
Notified rt, pt need cpap at night

## 2019-07-31 NOTE — Progress Notes (Signed)
ANTICOAGULATION CONSULT NOTE - Follow-Up  Pharmacy Consult for Heparin Indication: pulmonary embolus  Patient Measurements: Height: 6\' 2"  (188 cm) Weight: 203 lb 14.8 oz (92.5 kg) IBW/kg (Calculated) : 82.2 Heparin Dosing Weight: 99 kg  Vital Signs: Temp: 98 F (36.7 C) (08/15 0421) Temp Source: Oral (08/15 0421) BP: 142/84 (08/15 0800) Pulse Rate: 67 (08/15 0800)  Labs: Recent Labs    07/29/19 1650 07/29/19 1935  07/29/19 2223  07/30/19 0215  07/30/19 1109 07/30/19 1300 07/30/19 1523 07/30/19 1922 07/31/19 0100 07/31/19 0401  HGB 17.0  --   --   --   --  15.2   < >  --   --  14.2 14.5  --  13.8  HCT 49.0  --   --   --   --  44.4   < >  --   --  41.1 43.2  --  41.3  PLT 122*  --   --   --   --  100*   < >  --   --  78* 85*  --  83*  APTT 28  --   --   --   --  58*  --  >200*  --   --   --   --   --   LABPROT 13.8  --   --   --   --   --   --   --   --   --   --   --  14.7  INR 1.1  --   --   --   --   --   --   --   --   --   --   --  1.2  HEPARINUNFRC  --   --    < >  --    < >  --   --  1.10* <0.10*  --  0.37  --  <0.10*  CREATININE 1.26*  --   --   --   --  1.06  --   --   --   --   --  0.91  --   TROPONINIHS 93* 116*  --  114*  --   --   --   --   --   --   --   --   --    < > = values in this interval not displayed.    Estimated Creatinine Clearance: 95.3 mL/min (by C-G formula based on SCr of 0.91 mg/dL).   Assessment: 17 YOM who presented on 8/13 with massive acute B/L saddle PE in the setting of Xarelto non-compliance for hx DVT. The patient is s/p EKOS 8/14. Pharmacy consulted for anticoagulation with heparin.   Heparin level this afternoon is SUPRAtherapeutic after a rate increase however appears to be falsely elevated in relation to where the level was drawn. Earlier today, Heparin was switched from infusing in the [facility] to an IV site near the wrist to avoid interruptions with the patient bending his arm. This afternoon's Heparin level was drawn by the RN from  the [facility] and likely falsely elevated - will ask for phlebotomy to draw from the right arm (opposite heparin infusion). Will check out to the next pharmacy shift for follow-up.   Goal of Therapy:  Heparin level 0.3-0.7 units/mL (goal stays the same with EKOS) Monitor platelets by anticoagulation protocol: Yes   Plan:  - Continue Heparin at 1600 units/hr (16 m/hr) for now - Will re-enter a stat heparin  level to be drawn by phlebotomy in the opposite arm  - Will check out to the next pharmacy shift to determine if adjustments in the heparin drip are warranted.   Thank you for allowing pharmacy to be a part of this patient's care.  Alycia Rossetti, PharmD, BCPS Clinical Pharmacist Clinical phone for 07/31/2019: [phone] 07/31/2019 11:00 AM   **Pharmacist phone directory can now be found on [email or web address] ([email or web address]).  Listed under [place].

## 2019-07-31 NOTE — Progress Notes (Signed)
ANTICOAGULATION CONSULT NOTE - Follow-Up  Pharmacy Consult for Heparin Indication: pulmonary embolus  Patient Measurements: Height: 6\' 2"  (188 cm) Weight: 203 lb 14.8 oz (92.5 kg) IBW/kg (Calculated) : 82.2 Heparin Dosing Weight: 99 kg  Vital Signs: Temp: 98 F (36.7 C) (08/15 1145) Temp Source: Oral (08/15 1145) BP: 125/76 (08/15 1400) Pulse Rate: 64 (08/15 1400)  Labs: Recent Labs    07/29/19 1650 07/29/19 1935  07/29/19 2223  07/30/19 0215  07/30/19 1109  07/30/19 1523 07/30/19 1922 07/31/19 0100 07/31/19 0401 07/31/19 1301 07/31/19 1446  HGB 17.0  --   --   --   --  15.2   < >  --   --  14.2 14.5  --  13.8  --   --   HCT 49.0  --   --   --   --  44.4   < >  --   --  41.1 43.2  --  41.3  --   --   PLT 122*  --   --   --   --  100*   < >  --   --  78* 85*  --  83*  --   --   APTT 28  --   --   --   --  58*  --  >200*  --   --   --   --   --   --   --   LABPROT 13.8  --   --   --   --   --   --   --   --   --   --   --  14.7  --   --   INR 1.1  --   --   --   --   --   --   --   --   --   --   --  1.2  --   --   HEPARINUNFRC  --   --    < >  --    < >  --   --  1.10*   < >  --  0.37  --  <0.10* 1.34* 0.14*  CREATININE 1.26*  --   --   --   --  1.06  --   --   --   --   --  0.91  --   --   --   TROPONINIHS 93* 116*  --  114*  --   --   --   --   --   --   --   --   --   --   --    < > = values in this interval not displayed.    Estimated Creatinine Clearance: 95.3 mL/min (by C-G formula based on SCr of 0.91 mg/dL).   Assessment: 27 YOM who presented on 8/13 with massive acute B/L saddle PE in the setting of Xarelto non-compliance for hx DVT. The patient is s/p EKOS 8/14. Pharmacy consulted for anticoagulation with heparin.   Heparin level this afternoon is SUBtherapeutic at 0.14. Earlier this afternoon appeared to be SUPRAtherapeutic after a rate increase however this was falsely elevated in relation to where the level was drawn. Earlier today, Heparin was switched  from infusing in the [facility] to an IV site near the wrist to avoid interruptions with the patient bending his arm. Phlebotomy re-draw from the right arm (opposite heparin infusion) shows SUBtherapeutic level.  H&H WNL and Platelets low  but stable ~80s; no bleeding noted by nursing  Goal of Therapy:  Heparin level 0.3-0.7 units/mL (goal stays the same with EKOS) Monitor platelets by anticoagulation protocol: Yes   Plan:  - Increase Heparin drip to 1900 units/hr (19 m/hr) - Heparin level in 6 hours - Daily heparin level and CBC  Thank you for allowing pharmacy to be a part of this patient's care.  Kennon Holter, PharmD PGY1 Ambulatory Care Pharmacy Resident [employer] Phone: [phone] 07/31/2019 3:11 PM

## 2019-07-31 NOTE — Progress Notes (Signed)
Received pt from 2MW. Patient oriented to unit.  Call bell within reach, CCMD notified, bed in lowest position.

## 2019-08-01 DIAGNOSIS — I10 Essential (primary) hypertension: Secondary | ICD-10-CM

## 2019-08-01 DIAGNOSIS — D696 Thrombocytopenia, unspecified: Secondary | ICD-10-CM | POA: Diagnosis present

## 2019-08-01 DIAGNOSIS — I2602 Saddle embolus of pulmonary artery with acute cor pulmonale: Principal | ICD-10-CM

## 2019-08-01 DIAGNOSIS — E663 Overweight: Secondary | ICD-10-CM | POA: Diagnosis present

## 2019-08-01 DIAGNOSIS — Z789 Other specified health status: Secondary | ICD-10-CM

## 2019-08-01 DIAGNOSIS — Z7289 Other problems related to lifestyle: Secondary | ICD-10-CM

## 2019-08-01 DIAGNOSIS — J9601 Acute respiratory failure with hypoxia: Secondary | ICD-10-CM | POA: Diagnosis present

## 2019-08-01 LAB — BASIC METABOLIC PANEL
Anion gap: 8 (ref 5–15)
BUN: 6 mg/dL — ABNORMAL LOW (ref 8–23)
CO2: 23 mmol/L (ref 22–32)
Calcium: 8.3 mg/dL — ABNORMAL LOW (ref 8.9–10.3)
Chloride: 109 mmol/L (ref 98–111)
Creatinine, Ser: 0.82 mg/dL (ref 0.61–1.24)
GFR calc Af Amer: >60 mL/min (ref >60)
GFR calc non Af Amer: >60 mL/min (ref >60)
Glucose, Bld: 105 mg/dL — ABNORMAL HIGH (ref 70–99)
Potassium: 3.6 mmol/L (ref 3.5–5.1)
Sodium: 140 mmol/L (ref 135–145)

## 2019-08-01 LAB — HEPARIN LEVEL (UNFRACTIONATED)
Heparin Unfractionated: 0.33 IU/mL (ref 0.30–0.70)
Heparin Unfractionated: 0.43 IU/mL (ref 0.30–0.70)

## 2019-08-01 LAB — CBC
HCT: 39.7 % (ref 39.0–52.0)
Hemoglobin: 13.6 g/dL (ref 13.0–17.0)
MCH: 35.3 pg — ABNORMAL HIGH (ref 26.0–34.0)
MCHC: 34.3 g/dL (ref 30.0–36.0)
MCV: 103.1 fL — ABNORMAL HIGH (ref 80.0–100.0)
Platelets: 92 10*3/uL — ABNORMAL LOW (ref 150–400)
RBC: 3.85 MIL/uL — ABNORMAL LOW (ref 4.22–5.81)
RDW: 12.8 % (ref 11.5–15.5)
WBC: 6 10*3/uL (ref 4.0–10.5)
nRBC: 0 % (ref 0.0–0.2)

## 2019-08-01 LAB — MAGNESIUM: Magnesium: 1.8 mg/dL (ref 1.7–2.4)

## 2019-08-01 MED ORDER — RIVAROXABAN 15 MG PO TABS
15.0000 mg | ORAL_TABLET | Freq: Two times a day (BID) | ORAL | Status: DC
  Administered 2019-08-01 – 2019-08-02 (×2): 15 mg via ORAL
  Filled 2019-08-01 (×2): qty 1

## 2019-08-01 MED ORDER — RIVAROXABAN 20 MG PO TABS: 20.0000 mg | ORAL_TABLET | Freq: Every day | ORAL | Status: DC

## 2019-08-01 NOTE — Progress Notes (Addendum)
ANTICOAGULATION CONSULT NOTE - Follow-Up  Pharmacy Consult for Heparin Indication: pulmonary embolus  Patient Measurements: Height: 6\' 2"  (188 cm) Weight: 211 lb 14.4 oz (96.1 kg) IBW/kg (Calculated) : 82.2 Heparin Dosing Weight: 96.1 kg  Vital Signs: Temp: 98 F (36.7 C) (08/16 1236) Temp Source: Oral (08/16 1236) BP: 125/85 (08/16 1236) Pulse Rate: 62 (08/16 1236)  Labs: Recent Labs    07/29/19 1650 07/29/19 1935  07/29/19 2223  07/30/19 0215  07/30/19 1109  07/30/19 1922 07/31/19 0100 07/31/19 0401  07/31/19 1446 07/31/19 2142 08/01/19 0514  HGB 17.0  --   --   --   --  15.2   < >  --    < > 14.5  --  13.8  --   --   --  13.6  HCT 49.0  --   --   --   --  44.4   < >  --    < > 43.2  --  41.3  --   --   --  39.7  PLT 122*  --   --   --   --  100*   < >  --    < > 85*  --  83*  --   --   --  92*  APTT 28  --   --   --   --  58*  --  >200*  --   --   --   --   --   --   --   --   LABPROT 13.8  --   --   --   --   --   --   --   --   --   --  14.7  --   --   --   --   INR 1.1  --   --   --   --   --   --   --   --   --   --  1.2  --   --   --   --   HEPARINUNFRC  --   --    < >  --    < >  --   --  1.10*   < > 0.37  --  <0.10*   < > 0.14* 0.24* 0.33  CREATININE 1.26*  --   --   --   --  1.06  --   --   --   --  0.91  --   --   --   --  0.82  TROPONINIHS 93* 116*  --  114*  --   --   --   --   --   --   --   --   --   --   --   --    < > = values in this interval not displayed.    Estimated Creatinine Clearance: 105.8 mL/min (by C-G formula based on SCr of 0.82 mg/dL).   Assessment: 89 YOM who presented on 8/13 with massive acute B/L saddle PE in the setting of Xarelto non-compliance for hx DVT. The patient is s/p EKOS 8/14. Pharmacy consulted for anticoagulation with heparin.   8/16 am HL therapeutic but at low end of goal range at 0.33. CBC slightly trending down with H&H 13.6/39.7 and plts 92 (plts were 122 on admission)    Goal of Therapy:  Heparin level  0.3-0.7 units/mL (goal stays the same with EKOS) Monitor platelets by  anticoagulation protocol: Yes   Plan:  -continue heparin at 2100 units/hr -check confirmatory heparin level at 1300 - monitor daily heparin level, CBC, and s/sx of bleeding   Thank you,   Eddie Candle, PharmD PGY-1 Pharmacy Resident  Phone: [phone] Please check AMION for all [place] numbers 08/01/2019    I discussed / reviewed the pharmacy note by Dr. Consepcion Hearing and I agree with the resident's findings and plans as documented. - Confirmatory HL 0.43 remains in goal range. - Continue Heparin at 2100 units/hr and will recheck HL and CBC in AM.  Carsin Randazzo S. Alford Highland, PharmD, [place] Clinical Staff Pharmacist

## 2019-08-01 NOTE — Progress Notes (Signed)
RT went into pt room to ask if he wanted to wear cpap, pt stated he could place himself on cpap when ready for bed. RT made pt aware to call if he did need help.

## 2019-08-01 NOTE — Progress Notes (Signed)
ANTICOAGULATION CONSULT NOTE - Follow-Up  Pharmacy Consult for Xarelto Indication: pulmonary embolus  Patient Measurements: Height: 6\' 2"  (188 cm) Weight: 211 lb 14.4 oz (96.1 kg) IBW/kg (Calculated) : 82.2  Vital Signs: Temp: 98 F (36.7 C) (08/16 1236) Temp Source: Oral (08/16 1236) BP: 125/85 (08/16 1236) Pulse Rate: 62 (08/16 1236)  Labs: Recent Labs    07/29/19 1650 07/29/19 1935  07/29/19 2223  07/30/19 0215  07/30/19 1109  07/30/19 1922 07/31/19 0100 07/31/19 0401  07/31/19 2142 08/01/19 0514 08/01/19 1314  HGB 17.0  --   --   --   --  15.2   < >  --    < > 14.5  --  13.8  --   --  13.6  --   HCT 49.0  --   --   --   --  44.4   < >  --    < > 43.2  --  41.3  --   --  39.7  --   PLT 122*  --   --   --   --  100*   < >  --    < > 85*  --  83*  --   --  92*  --   APTT 28  --   --   --   --  58*  --  >200*  --   --   --   --   --   --   --   --   LABPROT 13.8  --   --   --   --   --   --   --   --   --   --  14.7  --   --   --   --   INR 1.1  --   --   --   --   --   --   --   --   --   --  1.2  --   --   --   --   HEPARINUNFRC  --   --    < >  --    < >  --   --  1.10*   < > 0.37  --  <0.10*   < > 0.24* 0.33 0.43  CREATININE 1.26*  --   --   --   --  1.06  --   --   --   --  0.91  --   --   --  0.82  --   TROPONINIHS 93* 116*  --  114*  --   --   --   --   --   --   --   --   --   --   --   --    < > = values in this interval not displayed.    Estimated Creatinine Clearance: 105.8 mL/min (by C-G formula based on SCr of 0.82 mg/dL).   Assessment: 76 YOM who presented on 8/13 with massive acute B/L saddle PE in the setting of Xarelto non-compliance for hx DVT. He apparently stopped taking his Xarelto several months ago d/t bruising. The patient is s/p EKOS 8/14. Pharmacy initially consulted for anticoagulation with heparin but now we are consulted to transition to Xarelto.  Heparin level remained therapeutic today.   CBC slightly trending down with H&H 13.6/39.7  and plts 92 (plts were 122 on admission)    Goal of Therapy:  Monitor platelets by anticoagulation  protocol: Yes   Plan:  -Start Xarelto 15mg  BID x 21 days then 20mg  daily therafter (20mg  daily will start on 9/6) -Discontinue heparin drip at the same time the first dose of Xarelto is given -Monitor CBC and s/sx of bleeding   Thank you,   Kennon Holter, PharmD PGY1 Ambulatory Care Pharmacy Resident [employer] Phone: [phone] 08/01/2019

## 2019-08-01 NOTE — Progress Notes (Signed)
PROGRESS NOTE  Derrick Reyes. WGY:[id number] DOB: 22-Jul-1956 DOA: 07/29/2019 PCP: Sofie Hartigan, MD  HPI/Recap of past 81 hours: 63 year old male with past medical history of hypertension, daily alcohol use and right lower extremity DVT who several months ago stopped his Xarelto because of bruising.  Patient presented to the emergency room on 8/13 with complaints of shortness of breath and lightheadedness with exertion.  He was found to be hypoxic requiring 30% FiO2.  COVID test negative.  CT scan noted acute saddle pulmonary embolus and bilateral submassive PE with right heart strain and cor pulmonale.  Patient not hypotensive but mildly tachycardic and dyspneic with activity.  Patient seen by interventional radiology and given EKOS lytic therapy.  Followed by critical care, transferred back to the hospitalist service after patient much more stabilized.  This morning, patient is comfortable on room air with no complaints.  Following treatment with EKOS, he was placed on heparin.  His platelet count which was slightly low on admission at 122 trended downward peaking as low as 78 and since then has been slowly trending upward, today at 93.    Assessment/Plan: Principal Problem:   Pulmonary embolism ([name]): Recurrent due to self stopping Xarelto.  Patient says he will not do this again.  Given upward trend and platelets, will try to restart Xarelto this evening and monitor given low platelet count. Active Problems:   BPH with obstruction/lower urinary tract symptoms: Stable   Anxiety and depression   Essential (primary) hypertension: Continue home medications   Acute respiratory failure with hypoxia ([name]): Resolved with treatment of PE   Alcohol use: Counseled to quit   Overweight (BMI 25.0-29.9): Patient meets criteria BMI greater than 25   Thrombocytopenia ([name]): Suspect he may have some chronic component given low platelet count on admission which may have been due to alcohol  drinking?  No signs of liver dysfunction on liver views of CT of chest.  Rest of liver function tests appear normal.  Immediate acute drop likely secondary to use of EKOS & heparin.  Restarting Xarelto this evening and if patient tolerates and platelets remained stable with no further bleeding, potential discharge tomorrow   Code Status: Full code  Family Communication: Discussed with patient, he says he will talk to his family  Disposition Plan: Anticipate discharge home tomorrow   Consultants:  Critical care  Interventional radiology  Procedures:  Thrombolytic treatment done by interventional radiology 8/13  Antimicrobials:  None  DVT prophylaxis: Heparin currently, to be changed to Xarelto   Objective: Vitals:   08/01/19 1025 08/01/19 1236  BP: 136/84 125/85  Pulse: 60 62  Resp:  18  Temp: 98.3 F (36.8 C) 98 F (36.7 C)  SpO2: 100% 96%    Intake/Output Summary (Last 24 hours) at 08/01/2019 1559 Last data filed at 08/01/2019 0835 Gross per 24 hour  Intake 1680.47 ml  Output 1450 ml  Net 230.47 ml   Filed Weights   07/31/19 0500 07/31/19 1533 08/01/19 0455  Weight: 92.5 kg 96.1 kg 96.1 kg   Body mass index is 27.21 kg/m.  Exam:   General: Alert and oriented x3, no acute distress  HEENT: Normocephalic atraumatic, mucous membranes are moist  Cardiovascular: Regular rate and rhythm, S1-S2  Respiratory: Clear to auscultation bilaterally  Abdomen: Soft, nontender, nondistended, positive bowel sounds  Musculoskeletal: No clubbing or cyanosis or edema  Skin: No skin breaks, tears or lesions  Psychiatry: Appropriate, no evidence of psychoses  Neuro: No focal deficits  Data Reviewed: CBC: Recent Labs  Lab 07/29/19 1650  07/30/19 0753 07/30/19 1523 07/30/19 1922 07/31/19 0401 08/01/19 0514  WBC 9.4   < > 7.5 6.2 7.3 6.5 6.0  NEUTROABS 7.7  --   --  5.0  --  5.2  --   HGB 17.0   < > 14.4 14.2 14.5 13.8 13.6  HCT 49.0   < > 42.9 41.1 43.2  41.3 39.7  MCV 102.9*   < > 106.2* 104.6* 105.9* 106.2* 103.1*  PLT 122*   < > 83* 78* 85* 83* 92*   < > = values in this interval not displayed.   Basic Metabolic Panel: Recent Labs  Lab 07/29/19 1650 07/30/19 0215 07/31/19 0100 07/31/19 0401 08/01/19 0514  NA 142 141 140  --  140  K 4.6 3.9 3.3*  --  3.6  CL 107 110 110  --  109  CO2 23 22 23   --  23  GLUCOSE 164* 107* 103*  --  105*  BUN 13 9 10   --  6*  CREATININE 1.26* 1.06 0.91  --  0.82  CALCIUM 8.9 8.1* 8.2*  --  8.3*  MG  --   --   --  1.8 1.8  PHOS  --   --   --  4.2  --    GFR: Estimated Creatinine Clearance: 105.8 mL/min (by C-G formula based on SCr of 0.82 mg/dL). Liver Function Tests: Recent Labs  Lab 07/29/19 1650 07/30/19 0215 07/31/19 0401  AST 49* 31 20  ALT 63* 49* 34  ALKPHOS 88 74 66  BILITOT 1.1 1.4* 0.7  PROT 6.6 5.5* 5.1*  ALBUMIN 3.6 3.0* 2.6*   Recent Labs  Lab 07/29/19 2223  LIPASE 32   No results for input(s): AMMONIA in the last 168 hours. Coagulation Profile: Recent Labs  Lab 07/29/19 1650 07/31/19 0401  INR 1.1 1.2   Cardiac Enzymes: No results for input(s): CKTOTAL, CKMB, CKMBINDEX, TROPONINI in the last 168 hours. BNP (last 3 results) No results for input(s): PROBNP in the last 8760 hours. HbA1C: No results for input(s): HGBA1C in the last 72 hours. CBG: Recent Labs  Lab 07/29/19 2156  GLUCAP 99   Lipid Profile: Recent Labs    07/31/19 0401  CHOL 101  HDL 27*  LDLCALC 55  TRIG 94  CHOLHDL 3.7   Thyroid Function Tests: No results for input(s): TSH, T4TOTAL, FREET4, T3FREE, THYROIDAB in the last 72 hours. Anemia Panel: No results for input(s): VITAMINB12, FOLATE, FERRITIN, TIBC, IRON, RETICCTPCT in the last 72 hours. Urine analysis: No results found for: COLORURINE, APPEARANCEUR, LABSPEC, PHURINE, GLUCOSEU, HGBUR, BILIRUBINUR, KETONESUR, PROTEINUR, UROBILINOGEN, NITRITE, LEUKOCYTESUR Sepsis Labs: @LABRCNTIP (procalcitonin:4,lacticidven:4)  ) Recent  Results (from the past 240 hour(s))  SARS Coronavirus 2 [facility] order, Performed in [facility] hospital lab) Nasopharyngeal Nasopharyngeal Swab     Status: None   Collection Time: 07/29/19  4:58 PM   Specimen: Nasopharyngeal Swab  Result Value Ref Range Status   SARS Coronavirus 2 NEGATIVE NEGATIVE Final    Comment: (NOTE) If result is NEGATIVE SARS-CoV-2 target nucleic acids are NOT DETECTED. The SARS-CoV-2 RNA is generally detectable in upper and lower  respiratory specimens during the acute phase of infection. The lowest  concentration of SARS-CoV-2 viral copies this assay can detect is 250  copies / mL. A negative result does not preclude SARS-CoV-2 infection  and should not be used as the sole basis for treatment or other  patient management decisions.  A negative  result may occur with  improper specimen collection / handling, submission of specimen other  than nasopharyngeal swab, presence of viral mutation(s) within the  areas targeted by this assay, and inadequate number of viral copies  (<250 copies / mL). A negative result must be combined with clinical  observations, patient history, and epidemiological information. If result is POSITIVE SARS-CoV-2 target nucleic acids are DETECTED. The SARS-CoV-2 RNA is generally detectable in upper and lower  respiratory specimens dur ing the acute phase of infection.  Positive  results are indicative of active infection with SARS-CoV-2.  Clinical  correlation with patient history and other diagnostic information is  necessary to determine patient infection status.  Positive results do  not rule out bacterial infection or co-infection with other viruses. If result is PRESUMPTIVE POSTIVE SARS-CoV-2 nucleic acids MAY BE PRESENT.   A presumptive positive result was obtained on the submitted specimen  and confirmed on repeat testing.  While 2019 novel coronavirus  (SARS-CoV-2) nucleic acids may be present in the submitted sample   additional confirmatory testing may be necessary for epidemiological  and / or clinical management purposes  to differentiate between  SARS-CoV-2 and other Sarbecovirus currently known to infect humans.  If clinically indicated additional testing with an alternate test  methodology [phone]) is advised. The SARS-CoV-2 RNA is generally  detectable in upper and lower respiratory sp ecimens during the acute  phase of infection. The expected result is Negative. Fact Sheet for Patients:  StrictlyIdeas.no Fact Sheet for Healthcare Providers: BankingDealers.co.za This test is not yet approved or cleared by the [place] FDA and has been authorized for detection and/or diagnosis of SARS-CoV-2 by FDA under an Emergency Use Authorization (EUA).  This EUA will remain in effect (meaning this test can be used) for the duration of the COVID-19 declaration under Section 564(b)(1) of the Act, 21 U.S.C. section 360bbb-3(b)(1), unless the authorization is terminated or revoked sooner. Performed at [facility], [address] [place], [place] [address]   MRSA PCR Screening     Status: None   Collection Time: 07/29/19  9:54 PM   Specimen: Nasal Mucosa; Nasopharyngeal  Result Value Ref Range Status   MRSA by PCR NEGATIVE NEGATIVE Final    Comment:        The GeneXpert MRSA Assay (FDA approved for NASAL specimens only), is one component of a comprehensive MRSA colonization surveillance program. It is not intended to diagnose MRSA infection nor to guide or monitor treatment for MRSA infections. Performed at [facility], [place] [address], [place], [place] [address]       Studies: No results found.  Scheduled Meds: . amLODipine  5 mg Oral Daily  . atorvastatin  10 mg Oral q1800  . Chlorhexidine Gluconate Cloth  6 each Topical Daily  . folic acid  1 mg Oral Daily  . loratadine  10 mg Oral QHS  . sertraline  100 mg Oral Daily   . thiamine  100 mg Oral Daily  . triamcinolone  1 spray Each Nare Daily    Continuous Infusions: . heparin 2,100 Units/hr (08/01/19 0826)  . lactated ringers 50 mL/hr at 08/01/19 0947     LOS: 3 days     Annita Brod, MD [facility]  To reach me or the doctor on call, go to: [email or web address] Password [facility]  08/01/2019, 3:59 PM

## 2019-08-01 NOTE — Plan of Care (Signed)
  Problem: Education: Goal: Knowledge of General Education information will improve Description: Including pain rating scale, medication(s)/side effects and non-pharmacologic comfort measures Outcome: Progressing   Problem: Health Behavior/Discharge Planning: Goal: Ability to manage health-related needs will improve Outcome: Progressing   Problem: Clinical Measurements: Goal: Diagnostic test results will improve Outcome: Progressing Goal: Respiratory complications will improve Outcome: Progressing Goal: Cardiovascular complication will be avoided Outcome: Progressing   Problem: Activity: Goal: Risk for activity intolerance will decrease Outcome: Progressing   Problem: Nutrition: Goal: Adequate nutrition will be maintained Outcome: Progressing   Problem: Coping: Goal: Level of anxiety will decrease Outcome: Progressing   Problem: Elimination: Goal: Will not experience complications related to bowel motility Outcome: Progressing   Problem: Pain Managment: Goal: General experience of comfort will improve Outcome: Progressing   Problem: Safety: Goal: Ability to remain free from injury will improve Outcome: Progressing   Problem: Skin Integrity: Goal: Risk for impaired skin integrity will decrease Outcome: Progressing   

## 2019-08-02 DIAGNOSIS — N401 Enlarged prostate with lower urinary tract symptoms: Secondary | ICD-10-CM

## 2019-08-02 DIAGNOSIS — F329 Major depressive disorder, single episode, unspecified: Secondary | ICD-10-CM

## 2019-08-02 DIAGNOSIS — N138 Other obstructive and reflux uropathy: Secondary | ICD-10-CM

## 2019-08-02 DIAGNOSIS — F419 Anxiety disorder, unspecified: Secondary | ICD-10-CM

## 2019-08-02 DIAGNOSIS — J9601 Acute respiratory failure with hypoxia: Secondary | ICD-10-CM

## 2019-08-02 MED ORDER — RIVAROXABAN (XARELTO) VTE STARTER PACK (15 & 20 MG): ORAL_TABLET | ORAL | 0 refills | Status: AC

## 2019-08-02 NOTE — Progress Notes (Signed)
Pt place cpap on short period of time and refused to wear when sleep.will continue monitor, rest in bed

## 2019-08-02 NOTE — Plan of Care (Signed)
  Problem: Education: Goal: Knowledge of General Education information will improve Description: Including pain rating scale, medication(s)/side effects and non-pharmacologic comfort measures Outcome: Progressing   Problem: Health Behavior/Discharge Planning: Goal: Ability to manage health-related needs will improve Outcome: Progressing   Problem: Clinical Measurements: Goal: Diagnostic test results will improve Outcome: Progressing Goal: Respiratory complications will improve Outcome: Progressing Goal: Cardiovascular complication will be avoided Outcome: Progressing   Problem: Activity: Goal: Risk for activity intolerance will decrease Outcome: Progressing   Problem: Nutrition: Goal: Adequate nutrition will be maintained Outcome: Progressing   Problem: Coping: Goal: Level of anxiety will decrease Outcome: Progressing   Problem: Elimination: Goal: Will not experience complications related to bowel motility Outcome: Progressing   Problem: Pain Managment: Goal: General experience of comfort will improve Outcome: Progressing   Problem: Safety: Goal: Ability to remain free from injury will improve Outcome: Progressing   Problem: Skin Integrity: Goal: Risk for impaired skin integrity will decrease Outcome: Progressing   

## 2019-08-02 NOTE — Discharge Summary (Signed)
Physician Discharge Summary  Derrick Reyes. SNK:[id number] DOB: 10-05-1956 DOA: 07/29/2019  PCP: Sofie Hartigan, MD  Admit date: 07/29/2019 Discharge date: 08/02/2019  Admitted From: Home Disposition: Home  Recommendations for Outpatient Follow-up:  1. Follow up with PCP in 1 week 2. Please obtain CBC in one week to follow-up platelet count 3. Patient restarted on Xarelto for recurrent PE, will need medication refills 4. Pulmonary recommend repeat chest x-ray in 2-3 weeks to monitor for improvement of pulmonary infarct 5. Patient will need referral for PFTs outpatient in several weeks to assess COPD with history of tobacco abuse and emphysematous changes on CT chest   Home Health: No Equipment/Devices: None  Discharge Condition: Stable CODE STATUS: Full code Diet recommendation: Heart Healthy   History of present illness:  Derrick Reyes. is a 63 year old male with past medical history of hypertension, daily alcohol use and right lower extremity DVT who several months ago stopped his Xarelto because of bruising.  Patient presented to the emergency room on 8/13 with complaints of shortness of breath and lightheadedness with exertion.  He was found to be hypoxic requiring 30% FiO2.  COVID test negative. CT scan noted acute saddle pulmonary embolus and bilateral submassive PE with right heart strain and cor pulmonale.    Given his significant PE burden, patient was initially admitted to the pulmonary critical care service and underwent EKOS lytic therapy.  Hospital course:  Acute hypoxic respiratory failure Submassive pulmonary embolism, bilateral; recurrent 2/2 med noncompliance Patient presenting with significant shortness of breath and was found to have a bilateral submassive saddle pulmonary embolism on CT chest.  Patient was initially admitted to the pulmonary/critical care service and underwent EKOS lytic therapy by interventional radiology.  He was continued on a  heparin drip.  His oxygen requirement slowly improved and he was titrated off of supplemental oxygen.  Patient's heparin drip was transitioned back to Xarelto, and which he will continue 15mg  PO BID for total of 21 days followed by 20 mg PO daily indefinitely.  Discussed with patient need for compliance with anticoagulation, and he understands.  Also discussed alcohol cessation given his thrombocytopenia.  Recommend repeat CBC in 1 week with follow-up PCP to further monitor platelets and medication refills.  Pulmonary critical care recommends repeat chest x-ray in 2-3 weeks to monitor for improvement of pulmonary infarct.  Thrombocytopenia Etiology likely chronic from underlying EtOH abuse.  Review of CT chest notes no signs of liver dysfunction.  LFTs normal.  Platelet count at time of discharge 92.  Discussed alcohol cessation.  Repeat CBC in 1 week with PCP.  Essential hypertension Continue amlodipine 10 mg p.o. daily  Depression: Continue sertraline 100 mg p.o. daily  HLD: Continue statin  EtOH abuse Discussed with patient need for cessation given his thrombocytopenia and need for chronic anticoagulation as above.  Tobacco abuse with changes of emphysema on CT chest Pulmonary critical care medicine recommends outpatient PFTs in several weeks to assess for underlying COPD.  Discussed tobacco cessation.  Obesity: BMI 26.77.  Discuss with patient lifestyle changes as this complicates all facets of care  Discharge Diagnoses:  Principal Problem:   Pulmonary embolism ([place]) Active Problems:   BPH with obstruction/lower urinary tract symptoms   Anxiety and depression   Essential (primary) hypertension   Alcohol use   Overweight (BMI 25.0-29.9)   Thrombocytopenia [facility])    Discharge Instructions  Discharge Instructions    Call MD for:  extreme fatigue   Complete by: As directed  Call MD for:  persistant dizziness or light-headedness   Complete by: As directed    Call MD for:   persistant nausea and vomiting   Complete by: As directed    Call MD for:  severe uncontrolled pain   Complete by: As directed    Call MD for:  temperature >100.4   Complete by: As directed    Diet - low sodium heart healthy   Complete by: As directed    Increase activity slowly   Complete by: As directed      Allergies as of 08/02/2019      Reactions   Losartan Hives   Codeine Anxiety   Lisinopril Anxiety      Medication List    STOP taking these medications   Xarelto 20 MG Tabs tablet Generic drug: rivaroxaban Replaced by: Rivaroxaban 15 & 20 MG Tbpk     TAKE these medications   amLODipine 10 MG tablet Commonly known as: NORVASC Take 10 mg by mouth daily.   atorvastatin 10 MG tablet Commonly known as: LIPITOR Take 10 mg by mouth daily at 6 PM.   Cinnamon 500 MG capsule Take 500 mg by mouth daily.   levocetirizine 5 MG tablet Commonly known as: XYZAL Take 5 mg by mouth every evening.   RA Krill Oil 500 MG Caps Take 500 mg by mouth daily.   Rivaroxaban 15 & 20 MG Tbpk Take as directed on package: Start with one 15mg  tablet by mouth twice a day with food. On Day 22, switch to one 20mg  tablet once a day with food. Replaces: Xarelto 20 MG Tabs tablet   sertraline 100 MG tablet Commonly known as: ZOLOFT Take 150 mg by mouth daily.   triamcinolone 55 MCG/ACT Aero nasal inhaler Commonly known as: NASACORT Place 1 spray into the nose daily as needed (congestion or allergies).      Follow-up Information    Feldpausch, Chrissie Noa, MD. Schedule an appointment as soon as possible for a visit in 1 week(s).   Specialty: Family Medicine Why: need refills xarelto Contact information: [address] [place] [address] [phone]          Allergies  Allergen Reactions  . Losartan Hives  . Codeine Anxiety  . Lisinopril Anxiety    Consultations:  PCCM  Interventional radiology   Procedures/Studies: Ct Angio Chest Pe W/cm &/or Wo Cm  Result Date:  07/29/2019 CLINICAL DATA:  Hypoxia and shortness of breath. Provided history of interstitial lung disease. EXAM: CT ANGIOGRAPHY CHEST WITH CONTRAST TECHNIQUE: Multidetector CT imaging of the chest was performed using the standard protocol during bolus administration of intravenous contrast. Multiplanar CT image reconstructions and MIPs were obtained to evaluate the vascular anatomy. CONTRAST:  73mL OMNIPAQUE IOHEXOL 350 MG/ML SOLN COMPARISON:  None. FINDINGS: Cardiovascular: Examination is positive for bilateral pulmonary emboli with large thromboembolic burden. Moderate saddle embolus with filling defects extending into all lobes and segmental branches of both lungs. Evidence of right heart strain with straightening of the intraventricular septum and RV to LV ratio of 2. Contrast refluxes into the hepatic veins and IVC. Thoracic aorta is normal in caliber with mild atherosclerosis. No evidence of aortic dissection. Mediastinum/Nodes: No enlarged mediastinal lymph nodes. No definite hilar adenopathy. The esophagus is decompressed. No visualized thyroid nodule. Lungs/Pleura: Left upper lobe/lingular perihilar consolidation with air bronchograms in slight surrounding ground-glass opacity. No peripheral opacities to suggest pulmonary infarct. Minimal apically emphysema. Trachea and mainstem bronchi are patent. No pleural fluid. Upper Abdomen: Gallstone,  partially included. No acute findings. Musculoskeletal: There are no acute or suspicious osseous abnormalities. Review of the MIP images confirms the above findings. IMPRESSION: 1. Examination is positive for bilateral pulmonary emboli with large thromboembolic burden including saddle embolus. Evidence of right heart strain with straightening of the intraventricular septum with RV to LV ratio of 2. The presence of right heart strain has been associated with an increased risk of morbidity and mortality. Please activate Code PE by paging [phone]. 2. Left upper  lobe/lingular perihilar consolidation with air bronchograms and slight surrounding ground-glass opacity. Findings are suspicious for pneumonia, however recommend radiographic follow-up after course of treatment to ensure resolution to exclude underlying malignancy. Aortic Atherosclerosis (ICD10-I70.0) and Emphysema (ICD10-J43.9). Critical Value/emergent results were called by telephone at the time of interpretation on 07/29/2019 at 7:06 pm to Dr. Judson Roch MONKS , who verbally acknowledged these results. Electronically Signed   By: Keith Rake M.D.   On: 07/29/2019 19:11   Ir Angiogram Pulmonary Bilateral Selective  Result Date: 07/30/2019 INDICATION: Bilateral submassive pulmonary embolism with saddle thrombus, cor pulmonale and high oxygen requirement. Emergent catheter directed thrombolytic therapy performed to treat bilateral submassive pulmonary embolism. EXAM: 1. ULTRASOUND GUIDANCE FOR VASCULAR ACCESS OF THE RIGHT COMMON FEMORAL VEIN 2. BILATERAL SELECTIVE PULMONARY ARTERIOGRAPHY 3. SELECTIVE LEFT UPPER LOBE AND LOWER LOBE PULMONARY ARTERIOGRAPHY 4. SELECTIVE RIGHT LOWER LOBE PULMONARY ARTERIOGRAPHY 5. BILATERAL ULTRASOUND ASSISTED CATHETER DIRECTED PULMONARY ARTERIAL THROMBOLYTIC INFUSION THERAPY FOR PULMONARY EMBOLISM MEDICATIONS: TPA infusion.  See below. ANESTHESIA/SEDATION: Moderate (conscious) sedation was employed during this procedure. A total of Versed 1.0 mg and Fentanyl 50 mcg was administered intravenously. Moderate Sedation Time: 46 minutes. The patient's level of consciousness and vital signs were monitored continuously by radiology nursing throughout the procedure under my direct supervision. CONTRAST:  15 mL Omnipaque 300 FLUOROSCOPY TIME:  Fluoroscopy Time: 13 minutes and 36 seconds. 114.8 mGy. COMPLICATIONS: None immediate. PROCEDURE: Informed consent was obtained from the patient following explanation of the procedure, risks, benefits and alternatives. The patient understands, agrees  and consents for the procedure. All questions were addressed. A time out was performed prior to the initiation of the procedure. Maximal barrier sterile technique utilized including caps, mask, sterile gowns, sterile gloves, large sterile drape, hand hygiene, and chlorhexidine prep. Ultrasound was used to confirm patency of the right common femoral vein. Under direct ultrasound guidance, access of the vein was performed with 2 separate micropuncture sets with ultrasound image documentation performed. Over guidewires, two parallel 6 French sheaths were placed to secure access. A 5 French JB1 catheter was initially advanced and used to attempt catheterization of the pulmonary outflow tract. This was exchanged for a 5 French H1 catheter. This was advanced into the main pulmonary artery and pressure measurements obtained. The catheter was further advanced into the left pulmonary artery and arteriography performed. Additional selective arteriography was performed at the level of left upper lobe and lower lobe pulmonary arteries. A guidewire was advanced into the lower lobe pulmonary artery and maintained for access. The H1 catheter was then advanced over a guidewire in the second right femoral venous sheath and used to selectively catheterize the right pulmonary artery. Arteriography was performed followed by additional selective arteriography at the level of the right lower lobe pulmonary artery. A guidewire was advanced into the right lower lobe pulmonary artery and maintain for access. An EKOS infusion catheter with 12 cm infusion length was then advanced over the guidewire extending into the left lower lobe pulmonary artery. The coaxial ultrasound catheter was  advanced and catheter position confirmed under fluoroscopy. A second EKOS infusion catheter with 18 cm infusion length was then advanced over the guidewire extending into the right lower lobe pulmonary artery. The coaxial ultrasound catheter was advanced and  infusion catheter position confirmed by fluoroscopy. Both infusion catheters were attached to separate EKOS pump systems and thrombolytic infusion was begun at 1 mg of tPA per hour via each infusion catheter. Both sheaths and catheters systems were secured at the level of the right groin and thigh with a Prolene retention suture and extensive overlying dressings. FINDINGS: After catheterization of the main pulmonary artery, pressure measurements demonstrate moderate elevation of pulmonary artery pressures with main PA pressure of 54/18, mean 30 mm Hg. An infusion catheter was advanced into essentially occlusive thrombus in the left lower lobe pulmonary artery. A second infusion catheter was advanced into nearly occlusive thrombus in the right lower lobe pulmonary artery. Bilateral thrombolytic infusion therapy with EKOS ultrasound assisted catheters will be begun and continued for initial 12 hour infusions at 1 mg of tPA per hour via each catheter. IMPRESSION: Moderate elevation of main pulmonary artery pressure measuring 54-18, mean 30 mm Hg. Bilateral thrombolytic infusion therapy with EKOS ultrasound assisted catheters positioned in both lower lobe pulmonary arteries and extending back into the main pulmonary artery segments will be begun and continued for initial 12 hour infusions at 1 mg of tPA per hour via each catheter. Electronically Signed   By: Aletta Edouard M.D.   On: 07/30/2019 10:03   Ir Angiogram Selective Each Additional Vessel  Result Date: 07/30/2019 INDICATION: Bilateral submassive pulmonary embolism with saddle thrombus, cor pulmonale and high oxygen requirement. Emergent catheter directed thrombolytic therapy performed to treat bilateral submassive pulmonary embolism. EXAM: 1. ULTRASOUND GUIDANCE FOR VASCULAR ACCESS OF THE RIGHT COMMON FEMORAL VEIN 2. BILATERAL SELECTIVE PULMONARY ARTERIOGRAPHY 3. SELECTIVE LEFT UPPER LOBE AND LOWER LOBE PULMONARY ARTERIOGRAPHY 4. SELECTIVE RIGHT LOWER LOBE  PULMONARY ARTERIOGRAPHY 5. BILATERAL ULTRASOUND ASSISTED CATHETER DIRECTED PULMONARY ARTERIAL THROMBOLYTIC INFUSION THERAPY FOR PULMONARY EMBOLISM MEDICATIONS: TPA infusion.  See below. ANESTHESIA/SEDATION: Moderate (conscious) sedation was employed during this procedure. A total of Versed 1.0 mg and Fentanyl 50 mcg was administered intravenously. Moderate Sedation Time: 46 minutes. The patient's level of consciousness and vital signs were monitored continuously by radiology nursing throughout the procedure under my direct supervision. CONTRAST:  15 mL Omnipaque 300 FLUOROSCOPY TIME:  Fluoroscopy Time: 13 minutes and 36 seconds. 114.8 mGy. COMPLICATIONS: None immediate. PROCEDURE: Informed consent was obtained from the patient following explanation of the procedure, risks, benefits and alternatives. The patient understands, agrees and consents for the procedure. All questions were addressed. A time out was performed prior to the initiation of the procedure. Maximal barrier sterile technique utilized including caps, mask, sterile gowns, sterile gloves, large sterile drape, hand hygiene, and chlorhexidine prep. Ultrasound was used to confirm patency of the right common femoral vein. Under direct ultrasound guidance, access of the vein was performed with 2 separate micropuncture sets with ultrasound image documentation performed. Over guidewires, two parallel 6 French sheaths were placed to secure access. A 5 French JB1 catheter was initially advanced and used to attempt catheterization of the pulmonary outflow tract. This was exchanged for a 5 French H1 catheter. This was advanced into the main pulmonary artery and pressure measurements obtained. The catheter was further advanced into the left pulmonary artery and arteriography performed. Additional selective arteriography was performed at the level of left upper lobe and lower lobe pulmonary arteries. A guidewire was advanced  into the lower lobe pulmonary artery and  maintained for access. The H1 catheter was then advanced over a guidewire in the second right femoral venous sheath and used to selectively catheterize the right pulmonary artery. Arteriography was performed followed by additional selective arteriography at the level of the right lower lobe pulmonary artery. A guidewire was advanced into the right lower lobe pulmonary artery and maintain for access. An EKOS infusion catheter with 12 cm infusion length was then advanced over the guidewire extending into the left lower lobe pulmonary artery. The coaxial ultrasound catheter was advanced and catheter position confirmed under fluoroscopy. A second EKOS infusion catheter with 18 cm infusion length was then advanced over the guidewire extending into the right lower lobe pulmonary artery. The coaxial ultrasound catheter was advanced and infusion catheter position confirmed by fluoroscopy. Both infusion catheters were attached to separate EKOS pump systems and thrombolytic infusion was begun at 1 mg of tPA per hour via each infusion catheter. Both sheaths and catheters systems were secured at the level of the right groin and thigh with a Prolene retention suture and extensive overlying dressings. FINDINGS: After catheterization of the main pulmonary artery, pressure measurements demonstrate moderate elevation of pulmonary artery pressures with main PA pressure of 54/18, mean 30 mm Hg. An infusion catheter was advanced into essentially occlusive thrombus in the left lower lobe pulmonary artery. A second infusion catheter was advanced into nearly occlusive thrombus in the right lower lobe pulmonary artery. Bilateral thrombolytic infusion therapy with EKOS ultrasound assisted catheters will be begun and continued for initial 12 hour infusions at 1 mg of tPA per hour via each catheter. IMPRESSION: Moderate elevation of main pulmonary artery pressure measuring 54-18, mean 30 mm Hg. Bilateral thrombolytic infusion therapy with  EKOS ultrasound assisted catheters positioned in both lower lobe pulmonary arteries and extending back into the main pulmonary artery segments will be begun and continued for initial 12 hour infusions at 1 mg of tPA per hour via each catheter. Electronically Signed   By: Aletta Edouard M.D.   On: 07/30/2019 10:03   Ir Angiogram Selective Each Additional Vessel  Result Date: 07/30/2019 INDICATION: Bilateral submassive pulmonary embolism with saddle thrombus, cor pulmonale and high oxygen requirement. Emergent catheter directed thrombolytic therapy performed to treat bilateral submassive pulmonary embolism. EXAM: 1. ULTRASOUND GUIDANCE FOR VASCULAR ACCESS OF THE RIGHT COMMON FEMORAL VEIN 2. BILATERAL SELECTIVE PULMONARY ARTERIOGRAPHY 3. SELECTIVE LEFT UPPER LOBE AND LOWER LOBE PULMONARY ARTERIOGRAPHY 4. SELECTIVE RIGHT LOWER LOBE PULMONARY ARTERIOGRAPHY 5. BILATERAL ULTRASOUND ASSISTED CATHETER DIRECTED PULMONARY ARTERIAL THROMBOLYTIC INFUSION THERAPY FOR PULMONARY EMBOLISM MEDICATIONS: TPA infusion.  See below. ANESTHESIA/SEDATION: Moderate (conscious) sedation was employed during this procedure. A total of Versed 1.0 mg and Fentanyl 50 mcg was administered intravenously. Moderate Sedation Time: 46 minutes. The patient's level of consciousness and vital signs were monitored continuously by radiology nursing throughout the procedure under my direct supervision. CONTRAST:  15 mL Omnipaque 300 FLUOROSCOPY TIME:  Fluoroscopy Time: 13 minutes and 36 seconds. 114.8 mGy. COMPLICATIONS: None immediate. PROCEDURE: Informed consent was obtained from the patient following explanation of the procedure, risks, benefits and alternatives. The patient understands, agrees and consents for the procedure. All questions were addressed. A time out was performed prior to the initiation of the procedure. Maximal barrier sterile technique utilized including caps, mask, sterile gowns, sterile gloves, large sterile drape, hand hygiene,  and chlorhexidine prep. Ultrasound was used to confirm patency of the right common femoral vein. Under direct ultrasound guidance, access of the vein  was performed with 2 separate micropuncture sets with ultrasound image documentation performed. Over guidewires, two parallel 6 French sheaths were placed to secure access. A 5 French JB1 catheter was initially advanced and used to attempt catheterization of the pulmonary outflow tract. This was exchanged for a 5 French H1 catheter. This was advanced into the main pulmonary artery and pressure measurements obtained. The catheter was further advanced into the left pulmonary artery and arteriography performed. Additional selective arteriography was performed at the level of left upper lobe and lower lobe pulmonary arteries. A guidewire was advanced into the lower lobe pulmonary artery and maintained for access. The H1 catheter was then advanced over a guidewire in the second right femoral venous sheath and used to selectively catheterize the right pulmonary artery. Arteriography was performed followed by additional selective arteriography at the level of the right lower lobe pulmonary artery. A guidewire was advanced into the right lower lobe pulmonary artery and maintain for access. An EKOS infusion catheter with 12 cm infusion length was then advanced over the guidewire extending into the left lower lobe pulmonary artery. The coaxial ultrasound catheter was advanced and catheter position confirmed under fluoroscopy. A second EKOS infusion catheter with 18 cm infusion length was then advanced over the guidewire extending into the right lower lobe pulmonary artery. The coaxial ultrasound catheter was advanced and infusion catheter position confirmed by fluoroscopy. Both infusion catheters were attached to separate EKOS pump systems and thrombolytic infusion was begun at 1 mg of tPA per hour via each infusion catheter. Both sheaths and catheters systems were secured at  the level of the right groin and thigh with a Prolene retention suture and extensive overlying dressings. FINDINGS: After catheterization of the main pulmonary artery, pressure measurements demonstrate moderate elevation of pulmonary artery pressures with main PA pressure of 54/18, mean 30 mm Hg. An infusion catheter was advanced into essentially occlusive thrombus in the left lower lobe pulmonary artery. A second infusion catheter was advanced into nearly occlusive thrombus in the right lower lobe pulmonary artery. Bilateral thrombolytic infusion therapy with EKOS ultrasound assisted catheters will be begun and continued for initial 12 hour infusions at 1 mg of tPA per hour via each catheter. IMPRESSION: Moderate elevation of main pulmonary artery pressure measuring 54-18, mean 30 mm Hg. Bilateral thrombolytic infusion therapy with EKOS ultrasound assisted catheters positioned in both lower lobe pulmonary arteries and extending back into the main pulmonary artery segments will be begun and continued for initial 12 hour infusions at 1 mg of tPA per hour via each catheter. Electronically Signed   By: Aletta Edouard M.D.   On: 07/30/2019 10:03   Ir US Guide Vasc Access Right  Result Date: 07/30/2019 INDICATION: Bilateral submassive pulmonary embolism with saddle thrombus, cor pulmonale and high oxygen requirement. Emergent catheter directed thrombolytic therapy performed to treat bilateral submassive pulmonary embolism. EXAM: 1. ULTRASOUND GUIDANCE FOR VASCULAR ACCESS OF THE RIGHT COMMON FEMORAL VEIN 2. BILATERAL SELECTIVE PULMONARY ARTERIOGRAPHY 3. SELECTIVE LEFT UPPER LOBE AND LOWER LOBE PULMONARY ARTERIOGRAPHY 4. SELECTIVE RIGHT LOWER LOBE PULMONARY ARTERIOGRAPHY 5. BILATERAL ULTRASOUND ASSISTED CATHETER DIRECTED PULMONARY ARTERIAL THROMBOLYTIC INFUSION THERAPY FOR PULMONARY EMBOLISM MEDICATIONS: TPA infusion.  See below. ANESTHESIA/SEDATION: Moderate (conscious) sedation was employed during this procedure. A  total of Versed 1.0 mg and Fentanyl 50 mcg was administered intravenously. Moderate Sedation Time: 46 minutes. The patient's level of consciousness and vital signs were monitored continuously by radiology nursing throughout the procedure under my direct supervision. CONTRAST:  15 mL Omnipaque 300 FLUOROSCOPY  TIME:  Fluoroscopy Time: 13 minutes and 36 seconds. 114.8 mGy. COMPLICATIONS: None immediate. PROCEDURE: Informed consent was obtained from the patient following explanation of the procedure, risks, benefits and alternatives. The patient understands, agrees and consents for the procedure. All questions were addressed. A time out was performed prior to the initiation of the procedure. Maximal barrier sterile technique utilized including caps, mask, sterile gowns, sterile gloves, large sterile drape, hand hygiene, and chlorhexidine prep. Ultrasound was used to confirm patency of the right common femoral vein. Under direct ultrasound guidance, access of the vein was performed with 2 separate micropuncture sets with ultrasound image documentation performed. Over guidewires, two parallel 6 French sheaths were placed to secure access. A 5 French JB1 catheter was initially advanced and used to attempt catheterization of the pulmonary outflow tract. This was exchanged for a 5 French H1 catheter. This was advanced into the main pulmonary artery and pressure measurements obtained. The catheter was further advanced into the left pulmonary artery and arteriography performed. Additional selective arteriography was performed at the level of left upper lobe and lower lobe pulmonary arteries. A guidewire was advanced into the lower lobe pulmonary artery and maintained for access. The H1 catheter was then advanced over a guidewire in the second right femoral venous sheath and used to selectively catheterize the right pulmonary artery. Arteriography was performed followed by additional selective arteriography at the level of the  right lower lobe pulmonary artery. A guidewire was advanced into the right lower lobe pulmonary artery and maintain for access. An EKOS infusion catheter with 12 cm infusion length was then advanced over the guidewire extending into the left lower lobe pulmonary artery. The coaxial ultrasound catheter was advanced and catheter position confirmed under fluoroscopy. A second EKOS infusion catheter with 18 cm infusion length was then advanced over the guidewire extending into the right lower lobe pulmonary artery. The coaxial ultrasound catheter was advanced and infusion catheter position confirmed by fluoroscopy. Both infusion catheters were attached to separate EKOS pump systems and thrombolytic infusion was begun at 1 mg of tPA per hour via each infusion catheter. Both sheaths and catheters systems were secured at the level of the right groin and thigh with a Prolene retention suture and extensive overlying dressings. FINDINGS: After catheterization of the main pulmonary artery, pressure measurements demonstrate moderate elevation of pulmonary artery pressures with main PA pressure of 54/18, mean 30 mm Hg. An infusion catheter was advanced into essentially occlusive thrombus in the left lower lobe pulmonary artery. A second infusion catheter was advanced into nearly occlusive thrombus in the right lower lobe pulmonary artery. Bilateral thrombolytic infusion therapy with EKOS ultrasound assisted catheters will be begun and continued for initial 12 hour infusions at 1 mg of tPA per hour via each catheter. IMPRESSION: Moderate elevation of main pulmonary artery pressure measuring 54-18, mean 30 mm Hg. Bilateral thrombolytic infusion therapy with EKOS ultrasound assisted catheters positioned in both lower lobe pulmonary arteries and extending back into the main pulmonary artery segments will be begun and continued for initial 12 hour infusions at 1 mg of tPA per hour via each catheter. Electronically Signed   By: Aletta Edouard M.D.   On: 07/30/2019 10:03   Dg Chest Port 1 View  Result Date: 07/31/2019 CLINICAL DATA:  Hypoxia EXAM: PORTABLE CHEST 1 VIEW COMPARISON:  Chest CT from 2 days ago FINDINGS: Airspace opacity on the left is not well visualized. Lung volumes are low and there is interstitial crowding. No Kerley lines, effusion, or  pneumothorax. Borderline heart size for technique. IMPRESSION: Low volume chest with vascular crowding. Left upper lobe airspace disease on recent chest CT is not well demonstrated. Electronically Signed   By: Monte Fantasia M.D.   On: 07/31/2019 09:36   Vas [place] Lower Extremity Venous (dvt)  Result Date: 08/01/2019  Lower Venous Study Indications: Pulmonary embolism.  Risk Factors: Confirmed PE. Comparison Study: 07/17/14 RT pop and ptv, resolved on study of 08/10/2014 No left                   for comparison Performing Technologist: Toma Copier RVS  Examination Guidelines: A complete evaluation includes B-mode imaging, spectral Doppler, color Doppler, and power Doppler as needed of all accessible portions of each vessel. Bilateral testing is considered an integral part of a complete examination. Limited examinations for reoccurring indications may be performed as noted.  +---------+---------------+---------+-----------+----------+-------+ RIGHT    CompressibilityPhasicitySpontaneityPropertiesSummary +---------+---------------+---------+-----------+----------+-------+ CFV      Full           Yes      Yes                          +---------+---------------+---------+-----------+----------+-------+ SFJ      Full                                                 +---------+---------------+---------+-----------+----------+-------+ FV Prox  Full           Yes      Yes                          +---------+---------------+---------+-----------+----------+-------+ FV Mid   Full                                                  +---------+---------------+---------+-----------+----------+-------+ FV DistalFull           Yes      Yes                          +---------+---------------+---------+-----------+----------+-------+ PFV      Full           Yes      Yes                          +---------+---------------+---------+-----------+----------+-------+ POP      Full           Yes      Yes                          +---------+---------------+---------+-----------+----------+-------+ PTV      Full                                                 +---------+---------------+---------+-----------+----------+-------+ PERO     Full                                                 +---------+---------------+---------+-----------+----------+-------+  Right Technical Findings: Difficult to image due to femoral sheath and bandage. Unable to rotate the leg for optimal imaging  +---------+---------------+---------+-----------+----------+-------------------+ LEFT     CompressibilityPhasicitySpontaneityPropertiesSummary             +---------+---------------+---------+-----------+----------+-------------------+ CFV      Full           Yes      Yes                                      +---------+---------------+---------+-----------+----------+-------------------+ SFJ      Full                                                             +---------+---------------+---------+-----------+----------+-------------------+ FV Prox  Full           Yes      Yes                                      +---------+---------------+---------+-----------+----------+-------------------+ FV Mid   Full                                                             +---------+---------------+---------+-----------+----------+-------------------+ FV DistalFull           Yes      Yes                                      +---------+---------------+---------+-----------+----------+-------------------+ PFV       Full           Yes      Yes                                      +---------+---------------+---------+-----------+----------+-------------------+ POP      None           No       No                   Acute               +---------+---------------+---------+-----------+----------+-------------------+ PTV      Partial                                      Acute in one of the                                                       paired veins  proximally          +---------+---------------+---------+-----------+----------+-------------------+ PERO                                                  Not visualized      +---------+---------------+---------+-----------+----------+-------------------+   Left Technical Findings: Minute flow noted in a small region of the popliteal.   Summary: Right: There is no evidence of deep vein thrombosis in the lower extremity. No cystic structure found in the popliteal fossa. See technical findings listed above Left: Findings consistent with acute deep vein thrombosis involving the left popliteal vein, and left posterior tibial veins. No cystic structure found in the popliteal fossa. See technical findings listed above  *See table(s) above for measurements and observations. Electronically signed by Ruta Hinds MD on 08/01/2019 at 12:15:18 PM.    Final    Ir Infusion Thrombol Arterial Initial (ms)  Result Date: 07/30/2019 INDICATION: Bilateral submassive pulmonary embolism with saddle thrombus, cor pulmonale and high oxygen requirement. Emergent catheter directed thrombolytic therapy performed to treat bilateral submassive pulmonary embolism. EXAM: 1. ULTRASOUND GUIDANCE FOR VASCULAR ACCESS OF THE RIGHT COMMON FEMORAL VEIN 2. BILATERAL SELECTIVE PULMONARY ARTERIOGRAPHY 3. SELECTIVE LEFT UPPER LOBE AND LOWER LOBE PULMONARY ARTERIOGRAPHY 4. SELECTIVE RIGHT LOWER LOBE PULMONARY  ARTERIOGRAPHY 5. BILATERAL ULTRASOUND ASSISTED CATHETER DIRECTED PULMONARY ARTERIAL THROMBOLYTIC INFUSION THERAPY FOR PULMONARY EMBOLISM MEDICATIONS: TPA infusion.  See below. ANESTHESIA/SEDATION: Moderate (conscious) sedation was employed during this procedure. A total of Versed 1.0 mg and Fentanyl 50 mcg was administered intravenously. Moderate Sedation Time: 46 minutes. The patient's level of consciousness and vital signs were monitored continuously by radiology nursing throughout the procedure under my direct supervision. CONTRAST:  15 mL Omnipaque 300 FLUOROSCOPY TIME:  Fluoroscopy Time: 13 minutes and 36 seconds. 114.8 mGy. COMPLICATIONS: None immediate. PROCEDURE: Informed consent was obtained from the patient following explanation of the procedure, risks, benefits and alternatives. The patient understands, agrees and consents for the procedure. All questions were addressed. A time out was performed prior to the initiation of the procedure. Maximal barrier sterile technique utilized including caps, mask, sterile gowns, sterile gloves, large sterile drape, hand hygiene, and chlorhexidine prep. Ultrasound was used to confirm patency of the right common femoral vein. Under direct ultrasound guidance, access of the vein was performed with 2 separate micropuncture sets with ultrasound image documentation performed. Over guidewires, two parallel 6 French sheaths were placed to secure access. A 5 French JB1 catheter was initially advanced and used to attempt catheterization of the pulmonary outflow tract. This was exchanged for a 5 French H1 catheter. This was advanced into the main pulmonary artery and pressure measurements obtained. The catheter was further advanced into the left pulmonary artery and arteriography performed. Additional selective arteriography was performed at the level of left upper lobe and lower lobe pulmonary arteries. A guidewire was advanced into the lower lobe pulmonary artery and maintained  for access. The H1 catheter was then advanced over a guidewire in the second right femoral venous sheath and used to selectively catheterize the right pulmonary artery. Arteriography was performed followed by additional selective arteriography at the level of the right lower lobe pulmonary artery. A guidewire was advanced into the right lower lobe pulmonary artery and maintain for access. An EKOS infusion catheter with 12 cm infusion length was then advanced over the guidewire extending into the left lower lobe  pulmonary artery. The coaxial ultrasound catheter was advanced and catheter position confirmed under fluoroscopy. A second EKOS infusion catheter with 18 cm infusion length was then advanced over the guidewire extending into the right lower lobe pulmonary artery. The coaxial ultrasound catheter was advanced and infusion catheter position confirmed by fluoroscopy. Both infusion catheters were attached to separate EKOS pump systems and thrombolytic infusion was begun at 1 mg of tPA per hour via each infusion catheter. Both sheaths and catheters systems were secured at the level of the right groin and thigh with a Prolene retention suture and extensive overlying dressings. FINDINGS: After catheterization of the main pulmonary artery, pressure measurements demonstrate moderate elevation of pulmonary artery pressures with main PA pressure of 54/18, mean 30 mm Hg. An infusion catheter was advanced into essentially occlusive thrombus in the left lower lobe pulmonary artery. A second infusion catheter was advanced into nearly occlusive thrombus in the right lower lobe pulmonary artery. Bilateral thrombolytic infusion therapy with EKOS ultrasound assisted catheters will be begun and continued for initial 12 hour infusions at 1 mg of tPA per hour via each catheter. IMPRESSION: Moderate elevation of main pulmonary artery pressure measuring 54-18, mean 30 mm Hg. Bilateral thrombolytic infusion therapy with EKOS  ultrasound assisted catheters positioned in both lower lobe pulmonary arteries and extending back into the main pulmonary artery segments will be begun and continued for initial 12 hour infusions at 1 mg of tPA per hour via each catheter. Electronically Signed   By: Aletta Edouard M.D.   On: 07/30/2019 10:03   Ir Infusion Thrombol Arterial Initial (ms)  Result Date: 07/30/2019 INDICATION: Bilateral submassive pulmonary embolism with saddle thrombus, cor pulmonale and high oxygen requirement. Emergent catheter directed thrombolytic therapy performed to treat bilateral submassive pulmonary embolism. EXAM: 1. ULTRASOUND GUIDANCE FOR VASCULAR ACCESS OF THE RIGHT COMMON FEMORAL VEIN 2. BILATERAL SELECTIVE PULMONARY ARTERIOGRAPHY 3. SELECTIVE LEFT UPPER LOBE AND LOWER LOBE PULMONARY ARTERIOGRAPHY 4. SELECTIVE RIGHT LOWER LOBE PULMONARY ARTERIOGRAPHY 5. BILATERAL ULTRASOUND ASSISTED CATHETER DIRECTED PULMONARY ARTERIAL THROMBOLYTIC INFUSION THERAPY FOR PULMONARY EMBOLISM MEDICATIONS: TPA infusion.  See below. ANESTHESIA/SEDATION: Moderate (conscious) sedation was employed during this procedure. A total of Versed 1.0 mg and Fentanyl 50 mcg was administered intravenously. Moderate Sedation Time: 46 minutes. The patient's level of consciousness and vital signs were monitored continuously by radiology nursing throughout the procedure under my direct supervision. CONTRAST:  15 mL Omnipaque 300 FLUOROSCOPY TIME:  Fluoroscopy Time: 13 minutes and 36 seconds. 114.8 mGy. COMPLICATIONS: None immediate. PROCEDURE: Informed consent was obtained from the patient following explanation of the procedure, risks, benefits and alternatives. The patient understands, agrees and consents for the procedure. All questions were addressed. A time out was performed prior to the initiation of the procedure. Maximal barrier sterile technique utilized including caps, mask, sterile gowns, sterile gloves, large sterile drape, hand hygiene, and  chlorhexidine prep. Ultrasound was used to confirm patency of the right common femoral vein. Under direct ultrasound guidance, access of the vein was performed with 2 separate micropuncture sets with ultrasound image documentation performed. Over guidewires, two parallel 6 French sheaths were placed to secure access. A 5 French JB1 catheter was initially advanced and used to attempt catheterization of the pulmonary outflow tract. This was exchanged for a 5 French H1 catheter. This was advanced into the main pulmonary artery and pressure measurements obtained. The catheter was further advanced into the left pulmonary artery and arteriography performed. Additional selective arteriography was performed at the level of left upper lobe and lower  lobe pulmonary arteries. A guidewire was advanced into the lower lobe pulmonary artery and maintained for access. The H1 catheter was then advanced over a guidewire in the second right femoral venous sheath and used to selectively catheterize the right pulmonary artery. Arteriography was performed followed by additional selective arteriography at the level of the right lower lobe pulmonary artery. A guidewire was advanced into the right lower lobe pulmonary artery and maintain for access. An EKOS infusion catheter with 12 cm infusion length was then advanced over the guidewire extending into the left lower lobe pulmonary artery. The coaxial ultrasound catheter was advanced and catheter position confirmed under fluoroscopy. A second EKOS infusion catheter with 18 cm infusion length was then advanced over the guidewire extending into the right lower lobe pulmonary artery. The coaxial ultrasound catheter was advanced and infusion catheter position confirmed by fluoroscopy. Both infusion catheters were attached to separate EKOS pump systems and thrombolytic infusion was begun at 1 mg of tPA per hour via each infusion catheter. Both sheaths and catheters systems were secured at the  level of the right groin and thigh with a Prolene retention suture and extensive overlying dressings. FINDINGS: After catheterization of the main pulmonary artery, pressure measurements demonstrate moderate elevation of pulmonary artery pressures with main PA pressure of 54/18, mean 30 mm Hg. An infusion catheter was advanced into essentially occlusive thrombus in the left lower lobe pulmonary artery. A second infusion catheter was advanced into nearly occlusive thrombus in the right lower lobe pulmonary artery. Bilateral thrombolytic infusion therapy with EKOS ultrasound assisted catheters will be begun and continued for initial 12 hour infusions at 1 mg of tPA per hour via each catheter. IMPRESSION: Moderate elevation of main pulmonary artery pressure measuring 54-18, mean 30 mm Hg. Bilateral thrombolytic infusion therapy with EKOS ultrasound assisted catheters positioned in both lower lobe pulmonary arteries and extending back into the main pulmonary artery segments will be begun and continued for initial 12 hour infusions at 1 mg of tPA per hour via each catheter. Electronically Signed   By: Aletta Edouard M.D.   On: 07/30/2019 10:03   Ir Jacolyn Reedy F/u Elizabeth Sauer Art/ven Final Day (ms)  Result Date: 07/30/2019 INDICATION: 63 year old with bilateral submassive pulmonary embolism with saddle embolism. Patient underwent ultrasound assisted catheter directed pulmonary arterial thrombolytics infusion therapy for 24 hours. The thrombolytics infusion was completed. EXAM: PULMONARY ARTERY PRESSURES FOLLOWING CATHETER DIRECTED THROMBOLYTIC THERAPY COMPARISON:  Catheter placement study 07/30/2019 MEDICATIONS: None. ANESTHESIA/SEDATION: None FLUOROSCOPY TIME:  None COMPLICATIONS: None immediate. TECHNIQUE: Patient has two vascular sheaths in the right groin. The ultrasound core transducers were removed from both catheters. Catheters were pulled back approximately 8-10 cm and pressures were obtained. Both infusion catheters  were removed. Subsequently, both vascular sheath removed with manual compression. FINDINGS: Post thrombolysis pressure in the main pulmonary artery is 32/19, mean pressure 24 mmHg. IMPRESSION: 1. Successful completion of the ultrasound assisted catheter directed thrombolytic therapy for pulmonary emboli. 2. Main pulmonary artery pressure has decreased from 30 to 24 mmHg after the catheter directed thrombolysis. Electronically Signed   By: Markus Daft M.D.   On: 07/30/2019 17:01     Transthoracic echocardiogram 07/30/2019: IMPRESSIONS    1. The left ventricle has normal systolic function, with an ejection fraction of 55-60%. The cavity size was normal. Left ventricular diastolic parameters were normal. No evidence of left ventricular regional wall motion abnormalities.  2. The right ventricle has moderately reduced systolic function. The cavity was normal. There is no increase in right  ventricular wall thickness. Right ventricular systolic pressure is moderately elevated at 52mmHg.  3. Left atrial size was mildly dilated.  4. The aortic valve is tricuspid. Mild sclerosis of the aortic valve.  5. There is mild dilatation of the ascending aorta measuring 38 mm.  6. The inferior vena cava was dilated in size with <50% respiratory variability.   Subjective:   Discharge Exam: Vitals:   08/02/19 0515 08/02/19 0901  BP: 131/80 (!) 141/92  Pulse: 65 66  Resp: 18   Temp: 98.4 F (36.9 C)   SpO2: 92%    Vitals:   08/01/19 1236 08/01/19 1935 08/02/19 0515 08/02/19 0901  BP: 125/85 (!) 144/90 131/80 (!) 141/92  Pulse: 62 (!) 58 65 66  Resp: 18 18 18    Temp: 98 F (36.7 C) 98.4 F (36.9 C) 98.4 F (36.9 C)   TempSrc: Oral Oral Oral   SpO2: 96% 95% 92%   Weight:   94.6 kg   Height:        General: Pt is alert, awake, not in acute distress Cardiovascular: RRR, S1/S2 +, no rubs, no gallops Respiratory: CTA bilaterally, no wheezing, no rhonchi Abdominal: Soft, NT, ND, bowel sounds  + Extremities: no edema, no cyanosis    The results of significant diagnostics from this hospitalization (including imaging, microbiology, ancillary and laboratory) are listed below for reference.     Microbiology: Recent Results (from the past 240 hour(s))  SARS Coronavirus 2 [facility] order, Performed in [facility] hospital lab) Nasopharyngeal Nasopharyngeal Swab     Status: None   Collection Time: 07/29/19  4:58 PM   Specimen: Nasopharyngeal Swab  Result Value Ref Range Status   SARS Coronavirus 2 NEGATIVE NEGATIVE Final    Comment: (NOTE) If result is NEGATIVE SARS-CoV-2 target nucleic acids are NOT DETECTED. The SARS-CoV-2 RNA is generally detectable in upper and lower  respiratory specimens during the acute phase of infection. The lowest  concentration of SARS-CoV-2 viral copies this assay can detect is 250  copies / mL. A negative result does not preclude SARS-CoV-2 infection  and should not be used as the sole basis for treatment or other  patient management decisions.  A negative result may occur with  improper specimen collection / handling, submission of specimen other  than nasopharyngeal swab, presence of viral mutation(s) within the  areas targeted by this assay, and inadequate number of viral copies  (<250 copies / mL). A negative result must be combined with clinical  observations, patient history, and epidemiological information. If result is POSITIVE SARS-CoV-2 target nucleic acids are DETECTED. The SARS-CoV-2 RNA is generally detectable in upper and lower  respiratory specimens dur ing the acute phase of infection.  Positive  results are indicative of active infection with SARS-CoV-2.  Clinical  correlation with patient history and other diagnostic information is  necessary to determine patient infection status.  Positive results do  not rule out bacterial infection or co-infection with other viruses. If result is PRESUMPTIVE POSTIVE SARS-CoV-2 nucleic acids  MAY BE PRESENT.   A presumptive positive result was obtained on the submitted specimen  and confirmed on repeat testing.  While 2019 novel coronavirus  (SARS-CoV-2) nucleic acids may be present in the submitted sample  additional confirmatory testing may be necessary for epidemiological  and / or clinical management purposes  to differentiate between  SARS-CoV-2 and other Sarbecovirus currently known to infect humans.  If clinically indicated additional testing with an alternate test  methodology [id number]) is advised. The SARS-CoV-2  RNA is generally  detectable in upper and lower respiratory sp ecimens during the acute  phase of infection. The expected result is Negative. Fact Sheet for Patients:  StrictlyIdeas.no Fact Sheet for Healthcare Providers: BankingDealers.co.za This test is not yet approved or cleared by the [place] FDA and has been authorized for detection and/or diagnosis of SARS-CoV-2 by FDA under an Emergency Use Authorization (EUA).  This EUA will remain in effect (meaning this test can be used) for the duration of the COVID-19 declaration under Section 564(b)(1) of the Act, 21 U.S.C. section 360bbb-3(b)(1), unless the authorization is terminated or revoked sooner. Performed at [facility], [address] [place], [place] [address]   MRSA PCR Screening     Status: None   Collection Time: 07/29/19  9:54 PM   Specimen: Nasal Mucosa; Nasopharyngeal  Result Value Ref Range Status   MRSA by PCR NEGATIVE NEGATIVE Final    Comment:        The GeneXpert MRSA Assay (FDA approved for NASAL specimens only), is one component of a comprehensive MRSA colonization surveillance program. It is not intended to diagnose MRSA infection nor to guide or monitor treatment for MRSA infections. Performed at [facility], [place] [address] [place], [place] [address]      Labs: BNP (last 3 results) Recent Labs     07/29/19 1650  BNP 081.4*   Basic Metabolic Panel: Recent Labs  Lab 07/29/19 1650 07/30/19 0215 07/31/19 0100 07/31/19 0401 08/01/19 0514  NA 142 141 140  --  140  K 4.6 3.9 3.3*  --  3.6  CL 107 110 110  --  109  CO2 23 22 23   --  23  GLUCOSE 164* 107* 103*  --  105*  BUN 13 9 10   --  6*  CREATININE 1.26* 1.06 0.91  --  0.82  CALCIUM 8.9 8.1* 8.2*  --  8.3*  MG  --   --   --  1.8 1.8  PHOS  --   --   --  4.2  --    Liver Function Tests: Recent Labs  Lab 07/29/19 1650 07/30/19 0215 07/31/19 0401  AST 49* 31 20  ALT 63* 49* 34  ALKPHOS 88 74 66  BILITOT 1.1 1.4* 0.7  PROT 6.6 5.5* 5.1*  ALBUMIN 3.6 3.0* 2.6*   Recent Labs  Lab 07/29/19 2223  LIPASE 32   No results for input(s): AMMONIA in the last 168 hours. CBC: Recent Labs  Lab 07/29/19 1650  07/30/19 0753 07/30/19 1523 07/30/19 1922 07/31/19 0401 08/01/19 0514  WBC 9.4   < > 7.5 6.2 7.3 6.5 6.0  NEUTROABS 7.7  --   --  5.0  --  5.2  --   HGB 17.0   < > 14.4 14.2 14.5 13.8 13.6  HCT 49.0   < > 42.9 41.1 43.2 41.3 39.7  MCV 102.9*   < > 106.2* 104.6* 105.9* 106.2* 103.1*  PLT 122*   < > 83* 78* 85* 83* 92*   < > = values in this interval not displayed.   Cardiac Enzymes: No results for input(s): CKTOTAL, CKMB, CKMBINDEX, TROPONINI in the last 168 hours. BNP: Invalid input(s): POCBNP CBG: Recent Labs  Lab 07/29/19 2156  GLUCAP 99   D-Dimer No results for input(s): DDIMER in the last 72 hours. Hgb A1c No results for input(s): HGBA1C in the last 72 hours. Lipid Profile Recent Labs    07/31/19 0401  CHOL 101  HDL 27*  LDLCALC 55  TRIG 94  CHOLHDL 3.7   Thyroid function studies No results for input(s): TSH, T4TOTAL, T3FREE, THYROIDAB in the last 72 hours.  Invalid input(s): FREET3 Anemia work up No results for input(s): VITAMINB12, FOLATE, FERRITIN, TIBC, IRON, RETICCTPCT in the last 72 hours. Urinalysis No results found for: COLORURINE, APPEARANCEUR, LABSPEC, [place], GLUCOSEU,  [place], BILIRUBINUR, KETONESUR, PROTEINUR, UROBILINOGEN, NITRITE, LEUKOCYTESUR Sepsis Labs Invalid input(s): PROCALCITONIN,  WBC,  [place] [facility] Recent Results (from the past 240 hour(s))  SARS Coronavirus 2 [facility] order, Performed in [facility] hospital lab) Nasopharyngeal Nasopharyngeal Swab     Status: None   Collection Time: 07/29/19  4:58 PM   Specimen: Nasopharyngeal Swab  Result Value Ref Range Status   SARS Coronavirus 2 NEGATIVE NEGATIVE Final    Comment: (NOTE) If result is NEGATIVE SARS-CoV-2 target nucleic acids are NOT DETECTED. The SARS-CoV-2 RNA is generally detectable in upper and lower  respiratory specimens during the acute phase of infection. The lowest  concentration of SARS-CoV-2 viral copies this assay can detect is 250  copies / mL. A negative result does not preclude SARS-CoV-2 infection  and should not be used as the sole basis for treatment or other  patient management decisions.  A negative result may occur with  improper specimen collection / handling, submission of specimen other  than nasopharyngeal swab, presence of viral mutation(s) within the  areas targeted by this assay, and inadequate number of viral copies  (<250 copies / mL). A negative result must be combined with clinical  observations, patient history, and epidemiological information. If result is POSITIVE SARS-CoV-2 target nucleic acids are DETECTED. The SARS-CoV-2 RNA is generally detectable in upper and lower  respiratory specimens dur ing the acute phase of infection.  Positive  results are indicative of active infection with SARS-CoV-2.  Clinical  correlation with patient history and other diagnostic information is  necessary to determine patient infection status.  Positive results do  not rule out bacterial infection or co-infection with other viruses. If result is PRESUMPTIVE POSTIVE SARS-CoV-2 nucleic acids MAY BE PRESENT.   A presumptive positive result was obtained on  the submitted specimen  and confirmed on repeat testing.  While 2019 novel coronavirus  (SARS-CoV-2) nucleic acids may be present in the submitted sample  additional confirmatory testing may be necessary for epidemiological  and / or clinical management purposes  to differentiate between  SARS-CoV-2 and other Sarbecovirus currently known to infect humans.  If clinically indicated additional testing with an alternate test  methodology [phone]) is advised. The SARS-CoV-2 RNA is generally  detectable in upper and lower respiratory sp ecimens during the acute  phase of infection. The expected result is Negative. Fact Sheet for Patients:  StrictlyIdeas.no Fact Sheet for Healthcare Providers: BankingDealers.co.za This test is not yet approved or cleared by the [place] FDA and has been authorized for detection and/or diagnosis of SARS-CoV-2 by FDA under an Emergency Use Authorization (EUA).  This EUA will remain in effect (meaning this test can be used) for the duration of the COVID-19 declaration under Section 564(b)(1) of the Act, 21 U.S.C. section 360bbb-3(b)(1), unless the authorization is terminated or revoked sooner. Performed at [facility], [address] [place], [place] [address]   MRSA PCR Screening     Status: None   Collection Time: 07/29/19  9:54 PM   Specimen: Nasal Mucosa; Nasopharyngeal  Result Value Ref Range Status   MRSA by PCR NEGATIVE NEGATIVE Final    Comment:  The GeneXpert MRSA Assay (FDA approved for NASAL specimens only), is one component of a comprehensive MRSA colonization surveillance program. It is not intended to diagnose MRSA infection nor to guide or monitor treatment for MRSA infections. Performed at [facility], [place] [address] [place], [place] [address]      Time coordinating discharge: Over 30 minutes  SIGNED:   Donnamarie Poag [place] DO  [facility] 08/02/2019, 9:15 AM

## 2019-08-02 NOTE — Progress Notes (Signed)
Pt discharge instructions reviewed with pt. Pt verbalizes understanding and states he has no questions. Pt belongings with pt. Pt is not in distress. Pt discharged via wheelchair. Pt's daughter is driving him home. 

## 2019-08-02 NOTE — TOC Transition Note (Addendum)
Transition of Care [facility]) - CM/SW Discharge Note   Patient Details  Name: Derrick Reyes. MRN: [id number] Date of Birth: February 05, 1956  Transition of Care [facility]) CM/SW Contact:  Zenon Mayo, RN Phone Number: 08/02/2019, 9:57 AM   Clinical Narrative:    Patient for discharge today, NCM received consult for xarelto.  Patient states he has been taking xarelto before he came to hospital and he had stopped taking it.  He states he has [employer] now and they should cover his xarelto, he does not have any problems getting his medications.  Patient gave NCM his insurance cared, NCM made copay and sent to Admissions.   Final next level of care: Home/Self Care Barriers to Discharge: No Barriers Identified   Patient Goals and CMS Choice Patient states their goals for this hospitalization and ongoing recovery are:: to go home   Choice offered to / list presented to : NA  Discharge Placement                       Discharge Plan and Services                DME Arranged: (NA)         HH Arranged: NA          Social Determinants of Health (SDOH) Interventions     Readmission Risk Interventions No flowsheet data found.

## 2019-10-30 IMAGING — CT CT ANGIOGRAPHY CHEST
2 of 6 series · 18 of 36 positions shown · IV contrast (APPLIED)
Comparison: None.

CLINICAL DATA: Hypoxia and shortness of breath. Provided history of
interstitial lung disease.

EXAM:
CT ANGIOGRAPHY CHEST WITH CONTRAST
TECHNIQUE: Multidetector CT imaging of the chest was performed using the
standard protocol during bolus administration of intravenous
contrast. Multiplanar CT image reconstructions and MIPs were
obtained to evaluate the vascular anatomy.
CONTRAST:  75mL OMNIPAQUE IOHEXOL 350 MG/ML SOLN

[Series 5: thins · axial · 0.71mm/px · z∈[-400,-106]mm · 17 of 328 slices shown]
[im 17/328  lung]
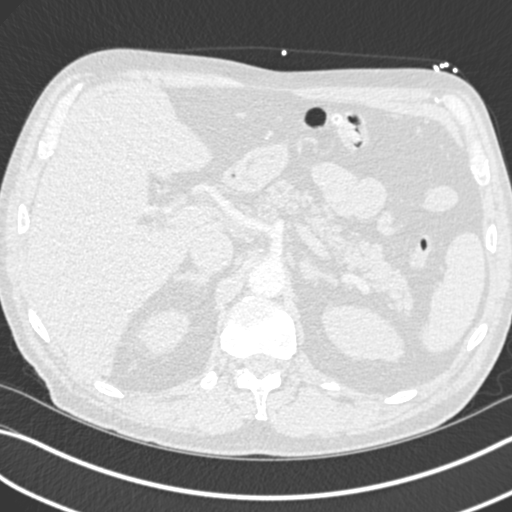
[im 33/328  mediastinal]
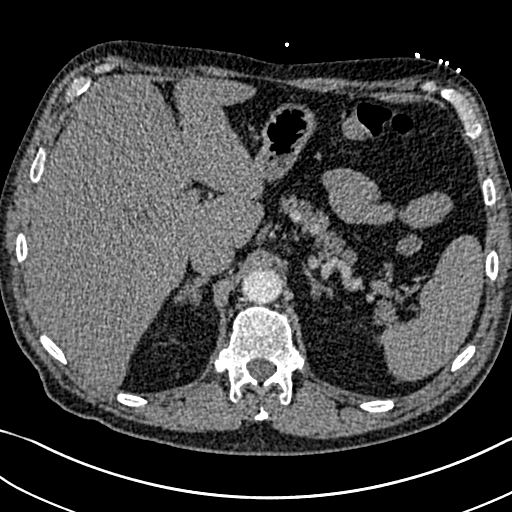
[im 50/328  lung]
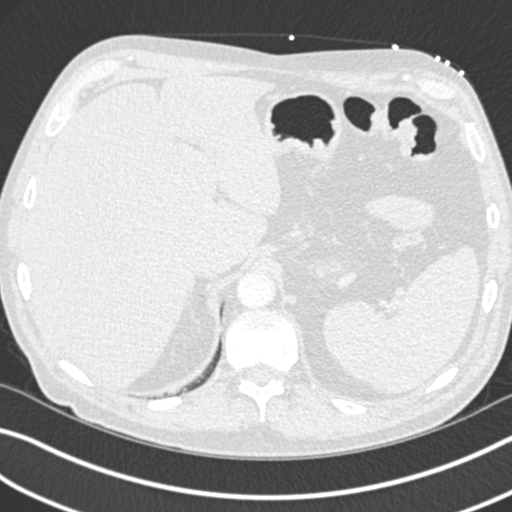
[im 66/328  mediastinal]
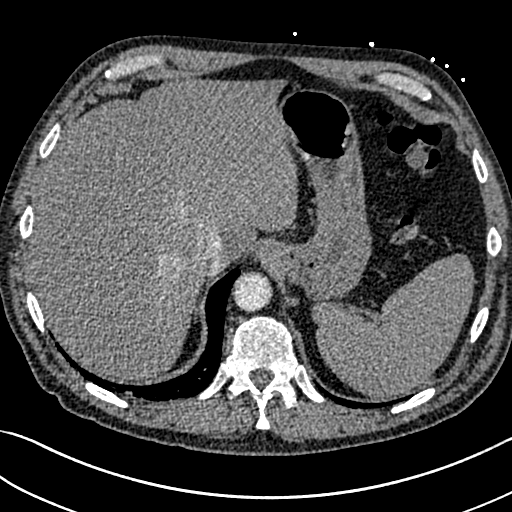
[im 99/328  lung]
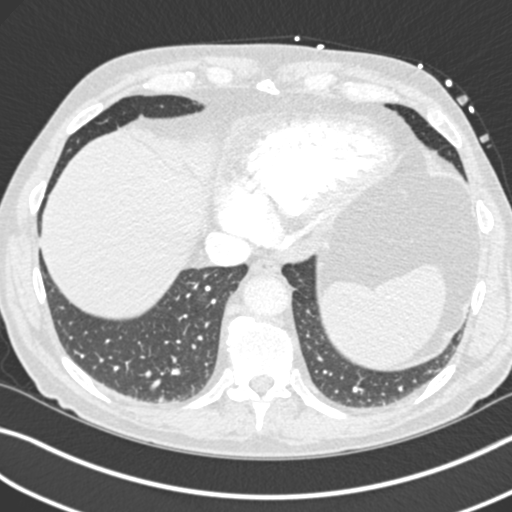
[im 115/328  mediastinal]
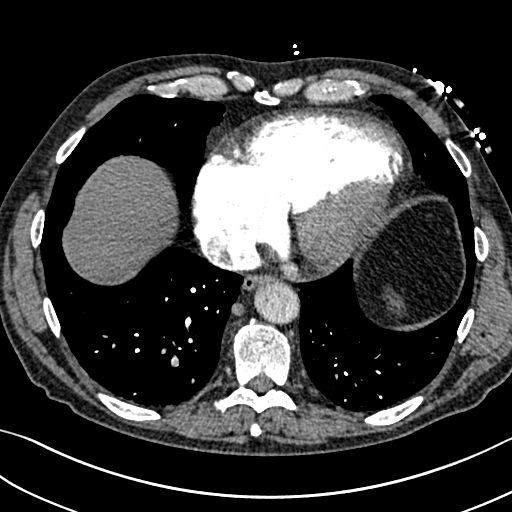
[im 131/328  lung]
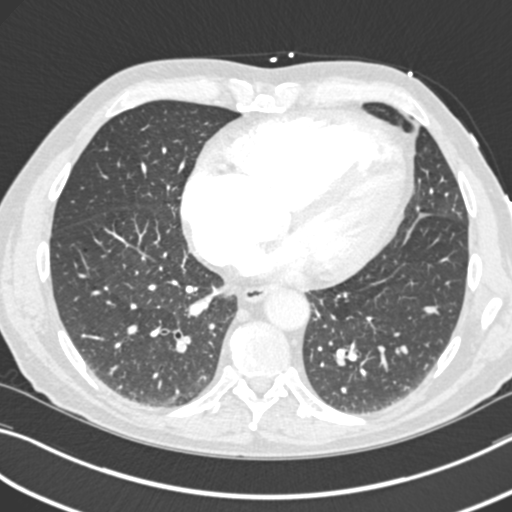
[im 148/328  mediastinal]
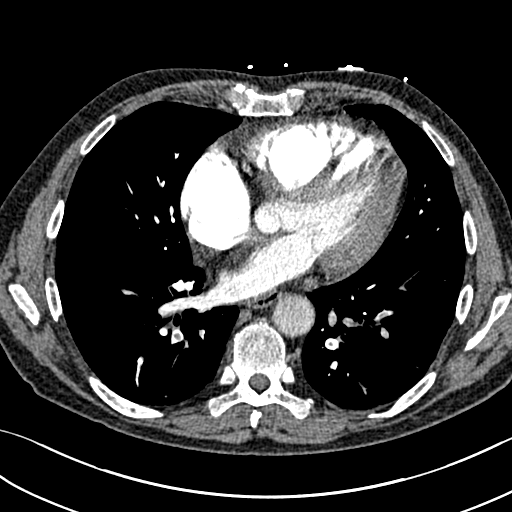
[im 164/328  lung]
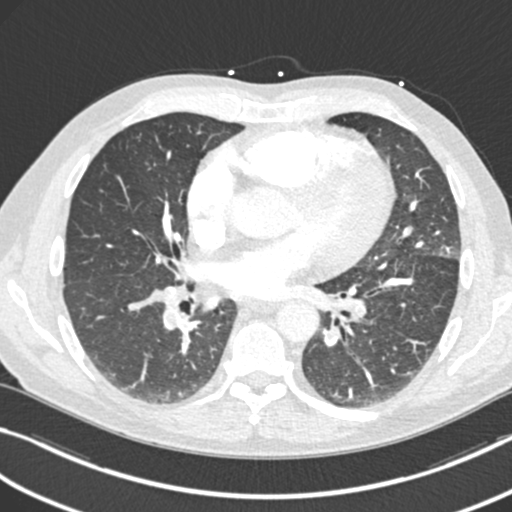
[im 180/328  mediastinal]
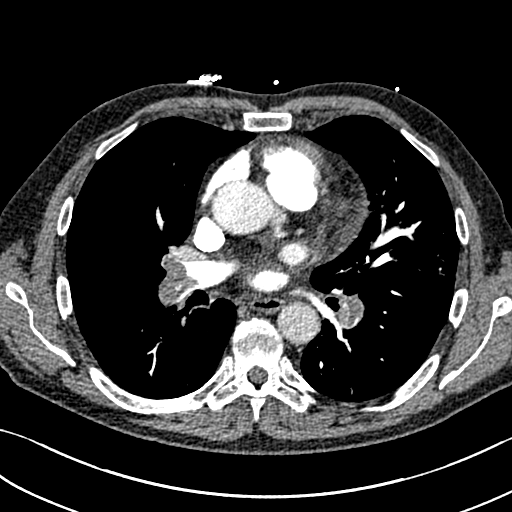
[im 197/328  lung]
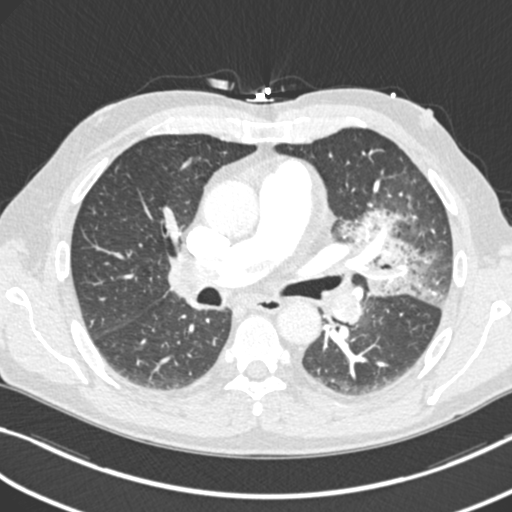
[im 213/328  mediastinal]
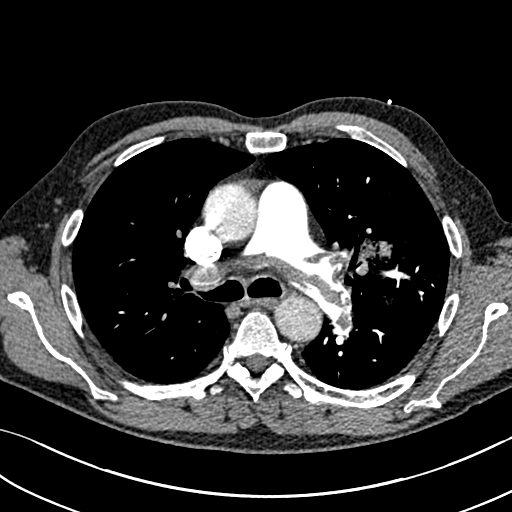
[im 229/328  lung]
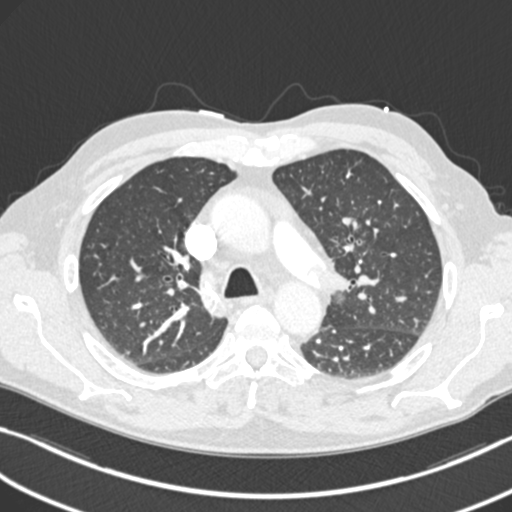
[im 262/328  mediastinal]
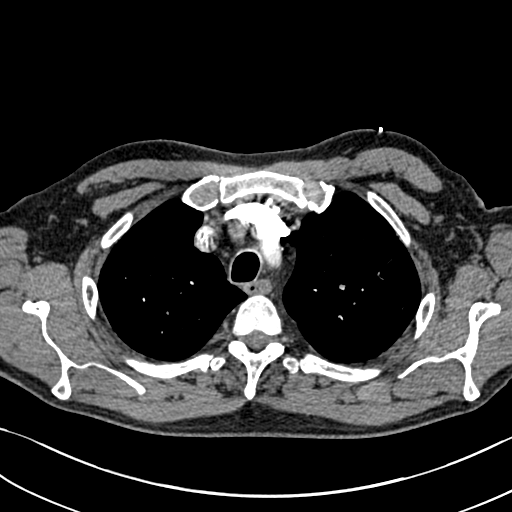
[im 278/328  lung]
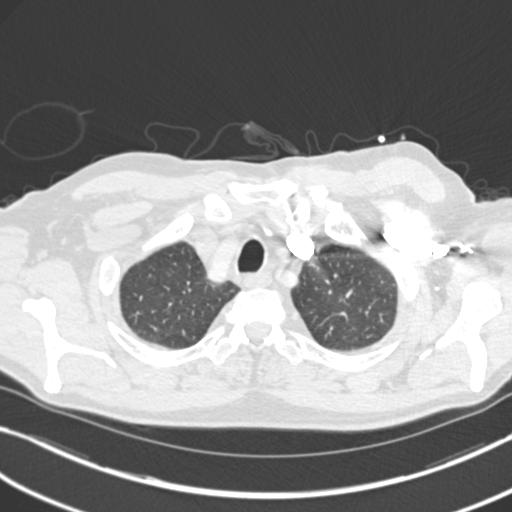
[im 295/328  mediastinal]
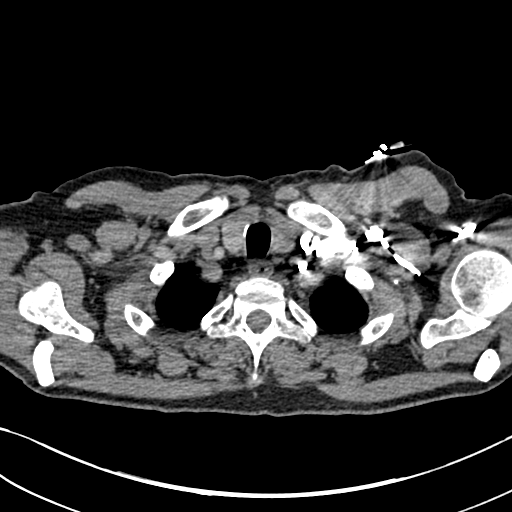
[im 311/328  lung]
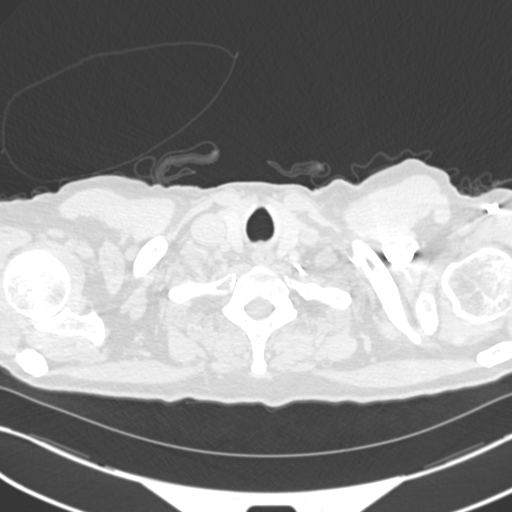

[Series 7: coronal mpr · coronal · 0.69mm/px · 1 of 99 slices shown]
[im 50/99  mediastinal]
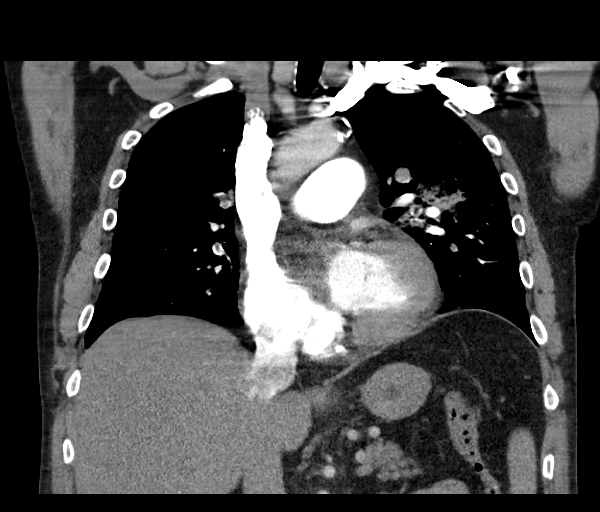

[18 of 36 positions shown; findings below may reference images not displayed]

FINDINGS: Cardiovascular: Examination is positive for bilateral pulmonary
emboli with large thromboembolic burden. Moderate saddle embolus
with filling defects extending into all lobes and segmental branches
of both lungs. Evidence of right heart strain with straightening of
the intraventricular septum and RV to LV ratio of 2. Contrast
refluxes into the hepatic veins and IVC. Thoracic aorta is normal in
caliber with mild atherosclerosis. No evidence of aortic dissection.

Mediastinum/Nodes: No enlarged mediastinal lymph nodes. No definite
hilar adenopathy. The esophagus is decompressed. No visualized
thyroid nodule.

Lungs/Pleura: Left upper lobe/lingular perihilar consolidation with
air bronchograms in slight surrounding ground-glass opacity. No
peripheral opacities to suggest pulmonary infarct. Minimal apically
emphysema. Trachea and mainstem bronchi are patent. No pleural
fluid.

Upper Abdomen: Gallstone, partially included. No acute findings.

Musculoskeletal: There are no acute or suspicious osseous
abnormalities.

Review of the MIP images confirms the above findings.
IMPRESSION: 1. Examination is positive for bilateral pulmonary emboli with large
thromboembolic burden including saddle embolus. Evidence of right
heart strain with straightening of the intraventricular septum with
RV to LV ratio of 2. The presence of right heart strain has been
associated with an increased risk of morbidity and mortality. Please
activate Code PE by paging [phone].
2. Left upper lobe/lingular perihilar consolidation with air
bronchograms and slight surrounding ground-glass opacity. Findings
are suspicious for pneumonia, however recommend radiographic
follow-up after course of treatment to ensure resolution to exclude
underlying malignancy.

Aortic Atherosclerosis (IXUYL-PDE.E) and Emphysema ([id number]-6GB.F).

Critical Value/emergent results were called by telephone at the time
of interpretation on 07/29/2019 at [DATE] to Dr. KLEAND LEO LEO , who
verbally acknowledged these results.

## 2019-10-30 IMAGING — US BILATERAL PULMONARY ARTERIOGRAPHY
1 series · 2 of 2 positions shown · non-contrast
Comparison: none

INDICATION: Bilateral submassive pulmonary embolism with saddle thrombus, cor
pulmonale and high oxygen requirement. Emergent catheter directed
thrombolytic therapy performed to treat bilateral submassive
pulmonary embolism.

[Series 1: bilateral pulmonary arteriography · 2 of 2 slices shown]
[im 1/2]
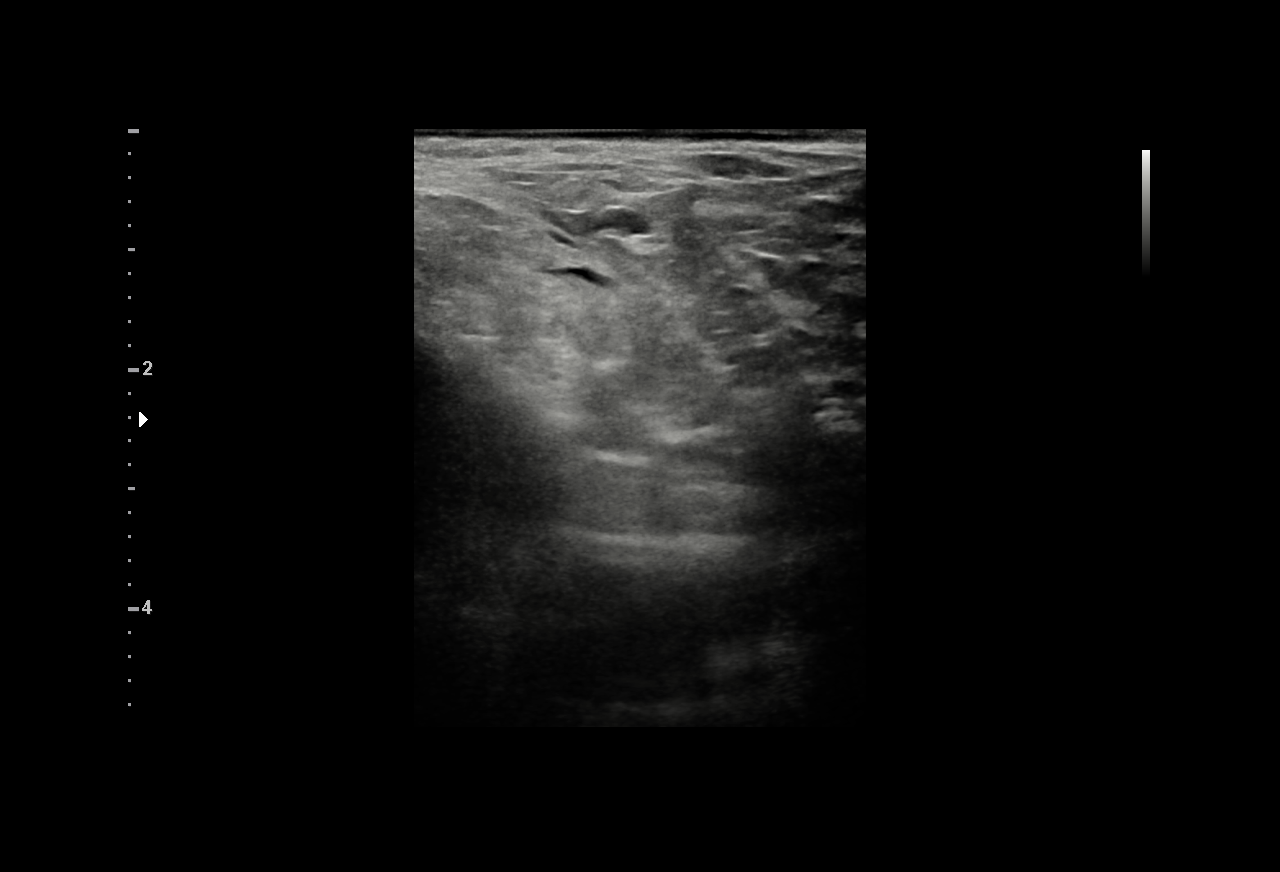
[im 2/2]
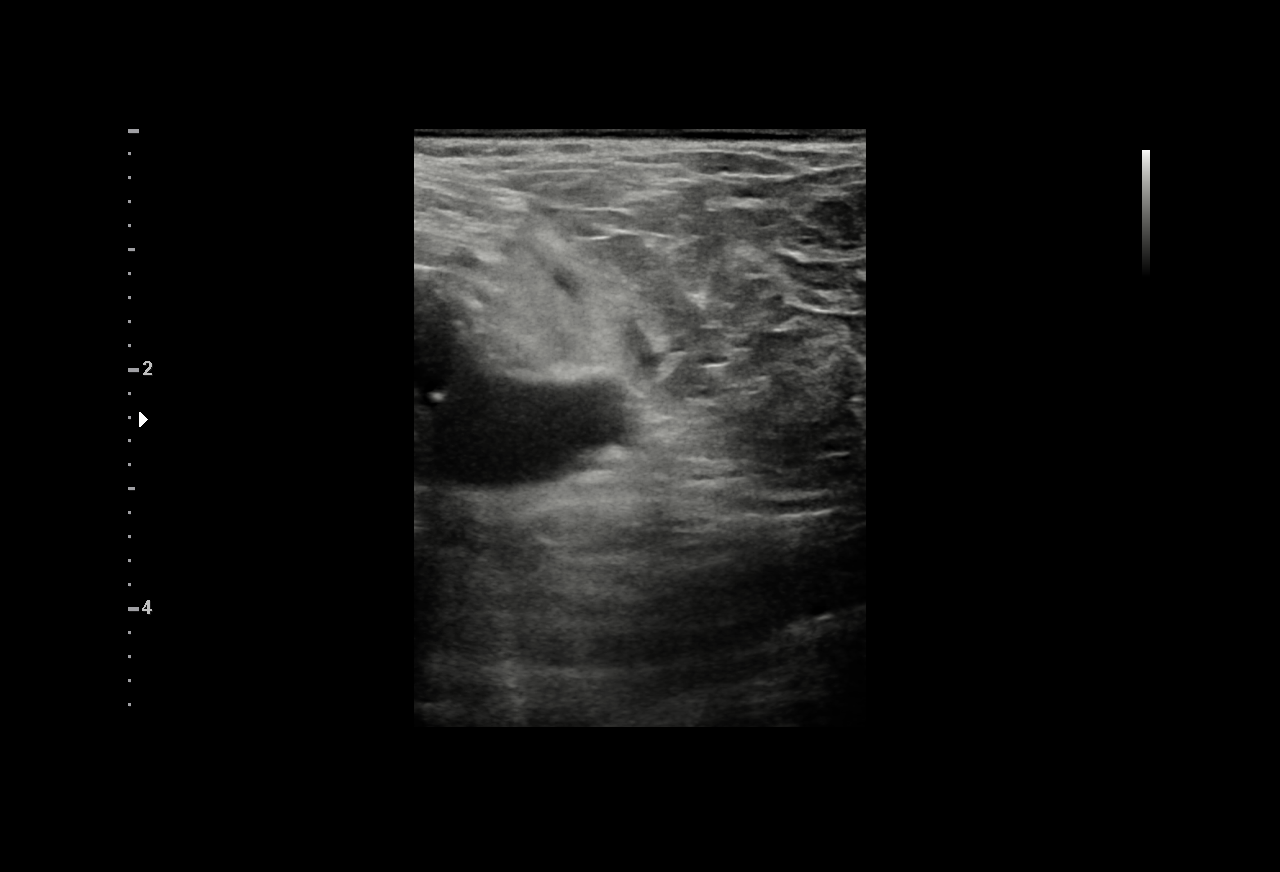

[2 of 2 positions shown; findings below may reference images not displayed]

EXAM:
1. ULTRASOUND GUIDANCE FOR VASCULAR ACCESS OF THE RIGHT COMMON
FEMORAL VEIN
2. BILATERAL SELECTIVE PULMONARY ARTERIOGRAPHY
3. SELECTIVE LEFT UPPER LOBE AND LOWER LOBE PULMONARY ARTERIOGRAPHY
4. SELECTIVE RIGHT LOWER LOBE PULMONARY ARTERIOGRAPHY
5. BILATERAL ULTRASOUND ASSISTED CATHETER DIRECTED PULMONARY
ARTERIAL THROMBOLYTIC INFUSION THERAPY FOR PULMONARY EMBOLISM

MEDICATIONS:
TPA infusion.  See below.

ANESTHESIA/SEDATION:
Moderate (conscious) sedation was employed during this procedure. A
total of Versed 1.0 mg and Fentanyl 50 mcg was administered
intravenously.

Moderate Sedation Time: 46 minutes. The patient's level of
consciousness and vital signs were monitored continuously by
radiology nursing throughout the procedure under my direct
supervision.

CONTRAST:  15 mL Omnipaque 300

FLUOROSCOPY TIME:  Fluoroscopy Time: 13 minutes and 36 seconds.
114.8 mGy.

COMPLICATIONS:
None immediate.



Ultrasound was used to confirm patency of the right common femoral
vein. Under direct ultrasound guidance, access of the vein was
performed with 2 separate micropuncture sets with ultrasound image
documentation performed. Over guidewires, two parallel 6 French
sheaths were placed to secure access.

A 5 French JB1 catheter was initially advanced and used to attempt
catheterization of the pulmonary outflow tract. This was exchanged
for a 5 French H1 catheter. This was advanced into the main
pulmonary artery and pressure measurements obtained. The catheter
was further advanced into the left pulmonary artery and
arteriography performed. Additional selective arteriography was
performed at the level of left upper lobe and lower lobe pulmonary
arteries. A guidewire was advanced into the lower lobe pulmonary
artery and maintained for access.

The H1 catheter was then advanced over a guidewire in the second
right femoral venous sheath and used to selectively catheterize the
right pulmonary artery. Arteriography was performed followed by
additional selective arteriography at the level of the right lower
lobe pulmonary artery. A guidewire was advanced into the right lower
lobe pulmonary artery and maintain for access.

An EKOS infusion catheter with 12 cm infusion length was then
advanced over the guidewire extending into the left lower lobe
pulmonary artery. The coaxial ultrasound catheter was advanced and
catheter position confirmed under fluoroscopy. A second EKOS
infusion catheter with 18 cm infusion length was then advanced over
the guidewire extending into the right lower lobe pulmonary artery.
The coaxial ultrasound catheter was advanced and infusion catheter
position confirmed by fluoroscopy.

Both infusion catheters were attached to separate EKOS pump systems
and thrombolytic infusion was begun at 1 mg of tPA per hour via each
infusion catheter. Both sheaths and catheters systems were secured
at the level of the right groin and thigh with a Prolene retention
suture and extensive overlying dressings.
FINDINGS: After catheterization of the main pulmonary artery, pressure
measurements demonstrate moderate elevation of pulmonary artery
pressures with main PA pressure of 54/18, mean 30 mm Hg. An infusion
catheter was advanced into essentially occlusive thrombus in the
left lower lobe pulmonary artery. A second infusion catheter was
advanced into nearly occlusive thrombus in the right lower lobe
pulmonary artery. Bilateral thrombolytic infusion therapy with EKOS
ultrasound assisted catheters will be begun and continued for
initial 12 hour infusions at 1 mg of tPA per hour via each catheter.
IMPRESSION: Moderate elevation of main pulmonary artery pressure measuring
54-18, mean 30 mm Hg. Bilateral thrombolytic infusion therapy with
EKOS ultrasound assisted catheters positioned in both lower lobe
pulmonary arteries and extending back into the main pulmonary artery
segments will be begun and continued for initial 12 hour infusions
at 1 mg of tPA per hour via each catheter.

## 2019-11-01 IMAGING — DX PORTABLE CHEST - 1 VIEW
1 series · 1 of 1 positions shown · non-contrast
Comparison: Chest CT from 2 days ago

CLINICAL DATA: Hypoxia

EXAM:
PORTABLE CHEST 1 VIEW

[chest ap]
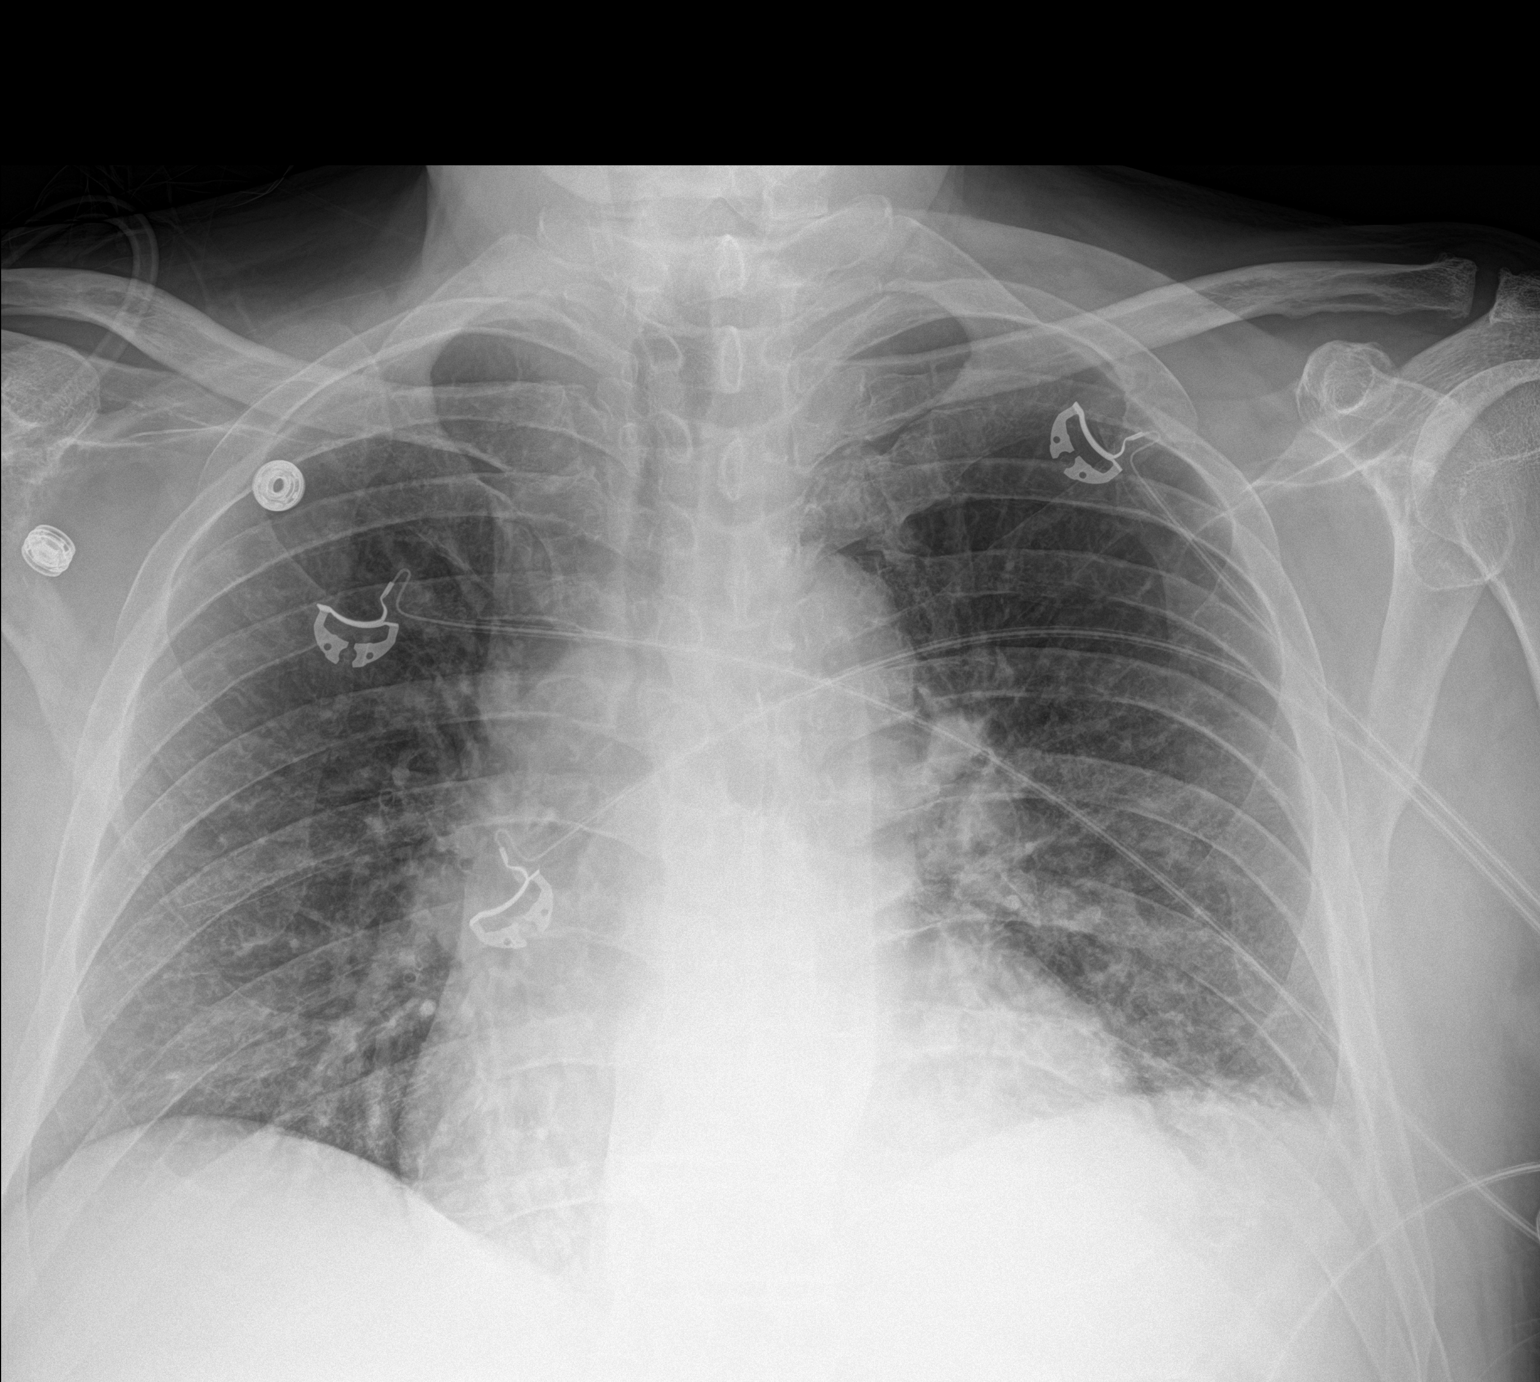

[1 of 1 positions shown; findings below may reference images not displayed]

FINDINGS: Airspace opacity on the left is not well visualized. Lung volumes
are low and there is interstitial crowding. No Kerley lines,
effusion, or pneumothorax. Borderline heart size for technique.
IMPRESSION: Low volume chest with vascular crowding. Left upper lobe airspace
disease on recent chest CT is not well demonstrated.

## 2020-04-25 ENCOUNTER — Other Ambulatory Visit
Admission: RE | Admit: 2020-04-25 | Discharge: 2020-04-25 | Disposition: A | Discharge status: Home / Self Care | Payer: Commercial Managed Care - PPO | Source: Ambulatory Visit | Attending: Internal Medicine | Admitting: Internal Medicine

## 2020-04-25 DIAGNOSIS — Z20822 Contact with and (suspected) exposure to covid-19: Secondary | ICD-10-CM | POA: Insufficient documentation

## 2020-04-25 DIAGNOSIS — Z01812 Encounter for preprocedural laboratory examination: Secondary | ICD-10-CM | POA: Diagnosis not present

## 2020-04-25 LAB — SARS CORONAVIRUS 2 (TAT 6-24 HRS): SARS Coronavirus 2: NEGATIVE

## 2020-04-27 ENCOUNTER — Ambulatory Visit: Payer: Commercial Managed Care - PPO | Admitting: Anesthesiology

## 2020-04-27 ENCOUNTER — Other Ambulatory Visit: Payer: Self-pay

## 2020-04-27 ENCOUNTER — Encounter
Admission: RE | Disposition: A | Discharge status: Home / Self Care | Payer: Self-pay | Source: Ambulatory Visit | Attending: Internal Medicine

## 2020-04-27 ENCOUNTER — Encounter: Payer: Self-pay | Admitting: Internal Medicine

## 2020-04-27 ENCOUNTER — Ambulatory Visit
Admission: RE | Admit: 2020-04-27 | Discharge: 2020-04-27 | Disposition: A | Discharge status: Home / Self Care | Payer: Commercial Managed Care - PPO | Source: Ambulatory Visit | Attending: Internal Medicine | Admitting: Internal Medicine

## 2020-04-27 DIAGNOSIS — D124 Benign neoplasm of descending colon: Secondary | ICD-10-CM | POA: Diagnosis not present

## 2020-04-27 DIAGNOSIS — I10 Essential (primary) hypertension: Secondary | ICD-10-CM | POA: Diagnosis not present

## 2020-04-27 DIAGNOSIS — G4730 Sleep apnea, unspecified: Secondary | ICD-10-CM | POA: Diagnosis not present

## 2020-04-27 DIAGNOSIS — J309 Allergic rhinitis, unspecified: Secondary | ICD-10-CM | POA: Insufficient documentation

## 2020-04-27 DIAGNOSIS — D123 Benign neoplasm of transverse colon: Secondary | ICD-10-CM | POA: Diagnosis not present

## 2020-04-27 DIAGNOSIS — F329 Major depressive disorder, single episode, unspecified: Secondary | ICD-10-CM | POA: Insufficient documentation

## 2020-04-27 DIAGNOSIS — K264 Chronic or unspecified duodenal ulcer with hemorrhage: Secondary | ICD-10-CM | POA: Insufficient documentation

## 2020-04-27 DIAGNOSIS — Z79899 Other long term (current) drug therapy: Secondary | ICD-10-CM | POA: Diagnosis not present

## 2020-04-27 DIAGNOSIS — F17200 Nicotine dependence, unspecified, uncomplicated: Secondary | ICD-10-CM | POA: Insufficient documentation

## 2020-04-27 DIAGNOSIS — Z885 Allergy status to narcotic agent status: Secondary | ICD-10-CM | POA: Diagnosis not present

## 2020-04-27 DIAGNOSIS — Z7901 Long term (current) use of anticoagulants: Secondary | ICD-10-CM | POA: Diagnosis not present

## 2020-04-27 DIAGNOSIS — K2960 Other gastritis without bleeding: Secondary | ICD-10-CM | POA: Insufficient documentation

## 2020-04-27 DIAGNOSIS — Z86718 Personal history of other venous thrombosis and embolism: Secondary | ICD-10-CM | POA: Insufficient documentation

## 2020-04-27 DIAGNOSIS — K640 First degree hemorrhoids: Secondary | ICD-10-CM | POA: Diagnosis not present

## 2020-04-27 DIAGNOSIS — K5730 Diverticulosis of large intestine without perforation or abscess without bleeding: Secondary | ICD-10-CM | POA: Insufficient documentation

## 2020-04-27 DIAGNOSIS — F419 Anxiety disorder, unspecified: Secondary | ICD-10-CM | POA: Diagnosis not present

## 2020-04-27 DIAGNOSIS — R195 Other fecal abnormalities: Secondary | ICD-10-CM | POA: Diagnosis present

## 2020-04-27 DIAGNOSIS — Z888 Allergy status to other drugs, medicaments and biological substances status: Secondary | ICD-10-CM | POA: Insufficient documentation

## 2020-04-27 DIAGNOSIS — E785 Hyperlipidemia, unspecified: Secondary | ICD-10-CM | POA: Insufficient documentation

## 2020-04-27 HISTORY — PX: COLONOSCOPY WITH PROPOFOL: SHX5780

## 2020-04-27 HISTORY — PX: ESOPHAGOGASTRODUODENOSCOPY (EGD) WITH PROPOFOL: SHX5813

## 2020-04-27 SURGERY — ESOPHAGOGASTRODUODENOSCOPY (EGD) WITH PROPOFOL
Anesthesia: General

## 2020-04-27 MED ORDER — BUTAMBEN-TETRACAINE-BENZOCAINE 2-2-14 % EX AERO
INHALATION_SPRAY | CUTANEOUS | Status: AC
  Filled 2020-04-27: qty 5

## 2020-04-27 MED ORDER — MIDAZOLAM HCL 2 MG/2ML IJ SOLN
INTRAMUSCULAR | Status: DC | PRN
  Administered 2020-04-27: 2 mg via INTRAVENOUS

## 2020-04-27 MED ORDER — SPOT INK MARKER SYRINGE KIT
PACK | SUBMUCOSAL | Status: DC | PRN
  Administered 2020-04-27: 2 mL via SUBMUCOSAL

## 2020-04-27 MED ORDER — FENTANYL CITRATE (PF) 100 MCG/2ML IJ SOLN
INTRAMUSCULAR | Status: DC | PRN
  Administered 2020-04-27: 50 ug via INTRAVENOUS

## 2020-04-27 MED ORDER — EPHEDRINE 5 MG/ML INJ
INTRAVENOUS | Status: AC
  Filled 2020-04-27: qty 10

## 2020-04-27 MED ORDER — PROPOFOL 10 MG/ML IV BOLUS
INTRAVENOUS | Status: AC
  Filled 2020-04-27: qty 20

## 2020-04-27 MED ORDER — FENTANYL CITRATE (PF) 100 MCG/2ML IJ SOLN
INTRAMUSCULAR | Status: AC
  Filled 2020-04-27: qty 2

## 2020-04-27 MED ORDER — PROPOFOL 500 MG/50ML IV EMUL
INTRAVENOUS | Status: DC | PRN
  Administered 2020-04-27: 150 ug/kg/min via INTRAVENOUS

## 2020-04-27 MED ORDER — PHENYLEPHRINE HCL (PRESSORS) 10 MG/ML IV SOLN
INTRAVENOUS | Status: DC | PRN
  Administered 2020-04-27 (×3): 50 ug via INTRAVENOUS

## 2020-04-27 MED ORDER — EPHEDRINE SULFATE 50 MG/ML IJ SOLN
INTRAMUSCULAR | Status: DC | PRN
  Administered 2020-04-27: 10 mg via INTRAVENOUS
  Administered 2020-04-27 (×2): 5 mg via INTRAVENOUS

## 2020-04-27 MED ORDER — SODIUM CHLORIDE 0.9 % IV SOLN
INTRAVENOUS | Status: DC
  Administered 2020-04-27: 1000 mL via INTRAVENOUS

## 2020-04-27 MED ORDER — LIDOCAINE HCL (CARDIAC) PF 100 MG/5ML IV SOSY
PREFILLED_SYRINGE | INTRAVENOUS | Status: DC | PRN
  Administered 2020-04-27: 50 mg via INTRAVENOUS

## 2020-04-27 MED ORDER — LIDOCAINE HCL (PF) 2 % IJ SOLN
INTRAMUSCULAR | Status: AC
  Filled 2020-04-27: qty 5

## 2020-04-27 MED ORDER — PROPOFOL 500 MG/50ML IV EMUL
INTRAVENOUS | Status: AC
  Filled 2020-04-27: qty 50

## 2020-04-27 MED ORDER — MIDAZOLAM HCL 2 MG/2ML IJ SOLN
INTRAMUSCULAR | Status: AC
  Filled 2020-04-27: qty 2

## 2020-04-27 MED ORDER — PHENYLEPHRINE HCL (PRESSORS) 10 MG/ML IV SOLN
INTRAVENOUS | Status: AC
  Filled 2020-04-27: qty 1

## 2020-04-27 NOTE — Anesthesia Procedure Notes (Signed)
Procedure Name: MAC Performed by: Vaughan Sine Pre-anesthesia Checklist: Patient identified, Emergency Drugs available, Suction available and Patient being monitored Patient Re-evaluated:Patient Re-evaluated prior to induction Oxygen Delivery Method: Nasal cannula Preoxygenation: Pre-oxygenation with 100% oxygen Induction Type: Combination inhalational/ intravenous induction Airway Equipment and Method: Bite block Placement Confirmation: positive ETCO2 and CO2 detector

## 2020-04-27 NOTE — Op Note (Addendum)
[facility] Gastroenterology Patient Name: Derrick Reyes Procedure Date: 04/27/2020 8:06 AM MRN: [id number] Account #: [id number] Date of Birth: 10-Jan-1956 Admit Type: Outpatient Age: 64 Room: [facility] ENDO ROOM 4 Gender: Male Note Status: Finalized Procedure:             Upper GI endoscopy Indications:           Heme positive stool Providers:             Benay Pike. Alice Reichert MD, MD Referring MD:          Sofie Hartigan (Referring MD) Medicines:             Propofol per Anesthesia Complications:         No immediate complications. Estimated blood loss:                         Minimal. Procedure:             Pre-Anesthesia Assessment:                        - The risks and benefits of the procedure and the                         sedation options and risks were discussed with the                         patient. All questions were answered and informed                         consent was obtained.                        - Patient identification and proposed procedure were                         verified prior to the procedure by the nurse. The                         procedure was verified in the procedure room.                        - ASA Grade Assessment: III - A patient with severe                         systemic disease.                        - After reviewing the risks and benefits, the patient                         was deemed in satisfactory condition to undergo the                         procedure.                        After obtaining informed consent, the endoscope was                         passed under direct vision. Throughout the procedure,  the patient's blood pressure, pulse, and oxygen                         saturations were monitored continuously. The Endoscope                         was introduced through the mouth, and advanced to the                         third part of duodenum. The upper GI endoscopy was                      accomplished without difficulty. The patient tolerated                         the procedure well. Findings:      The esophagus was normal.      Segmental moderate inflammation characterized by erosions, erythema and       linear erosions was found in the gastric antrum. Biopsies were taken       with a cold forceps for Helicobacter pylori testing.      The cardia and gastric fundus were normal on retroflexion.      A few localized erosions with bleeding on contact were found in the       second portion of the duodenum.      The first portion of the duodenum and third portion of the duodenum were       normal.      The exam was otherwise without abnormality. Impression:            - Normal esophagus.                        - Gastritis. Biopsied.                        - Erosive duodenopathy with bleeding on contact.                        - Normal first portion of the duodenum and third                         portion of the duodenum.                        - The examination was otherwise normal. Recommendation:        - Await pathology results.                        - Proceed with colonoscopy Procedure Code(s):     --- Professional ---                        [phone], Esophagogastroduodenoscopy, flexible,                         transoral; with biopsy, single or multiple Diagnosis Code(s):     --- Professional ---                        R19.5, Other fecal abnormalities  K92.2, Gastrointestinal hemorrhage, unspecified                        K29.70, Gastritis, unspecified, without bleeding CPT copyright 2019 American Medical Association. All rights reserved. The codes documented in this report are preliminary and upon coder review may  be revised to meet current compliance requirements. Efrain Sella MD, MD 04/27/2020 8:42:25 AM This report has been signed electronically. Number of Addenda: 0 Note Initiated On: 04/27/2020 8:06 AM Estimated  Blood Loss:  Estimated blood loss was minimal.      [facility]

## 2020-04-27 NOTE — Op Note (Addendum)
[facility] Gastroenterology Patient Name: Derrick Reyes Procedure Date: 04/27/2020 8:05 AM MRN: [id number] Account #: [id number] Date of Birth: [birth year] Admit Type: Outpatient Age: 64 Room: [facility] ENDO ROOM 4 Gender: Male Note Status: Finalized Procedure:             Colonoscopy Indications:           Heme positive stool Providers:             Benay Pike. Alice Reichert MD, MD Referring MD:          Sofie Hartigan (Referring MD) Medicines:             Propofol per Anesthesia Complications:         No immediate complications. Estimated blood loss:                         Minimal. Procedure:             Pre-Anesthesia Assessment:                        - The risks and benefits of the procedure and the                         sedation options and risks were discussed with the                         patient. All questions were answered and informed                         consent was obtained.                        - Patient identification and proposed procedure were                         verified prior to the procedure by the nurse. The                         procedure was verified in the procedure room.                        - ASA Grade Assessment: III - A patient with severe                         systemic disease.                        - After reviewing the risks and benefits, the patient                         was deemed in satisfactory condition to undergo the                         procedure.                        After obtaining informed consent, the colonoscope was                         passed under direct vision. Throughout the procedure,  the patient's blood pressure, pulse, and oxygen                         saturations were monitored continuously. The                         Colonoscope was introduced through the anus and                         advanced to the the cecum, identified by appendiceal   orifice and ileocecal valve. The colonoscopy was                         performed without difficulty. The patient tolerated                         the procedure well. The quality of the bowel                         preparation was good. The ileocecal valve, appendiceal                         orifice, and rectum were photographed. Findings:      The perianal and digital rectal examinations were normal. Pertinent       negatives include normal sphincter tone and no palpable rectal lesions.      Non-bleeding internal hemorrhoids were found during retroflexion. The       hemorrhoids were Grade I (internal hemorrhoids that do not prolapse).      Many medium-mouthed diverticula were found in the left colon.      Two pedunculated polyps were found in the transverse colon. The polyps       were 7 to 10 mm in size. These polyps were removed with a hot snare.       Resection and retrieval were complete.      A 5 mm polyp was found in the transverse colon. The polyp was sessile.       The polyp was removed with a jumbo cold forceps. Resection and retrieval       were complete.      Two semi-pedunculated polyps were found in the descending colon. The       polyps were 7 to 9 mm in size. These polyps were removed with a hot       snare. Resection and retrieval were complete.      A 12 mm polyp was found in the transverse colon. The polyp was       semi-pedunculated. The polyp was removed with a hot snare. Resection and       retrieval were complete. To prevent bleeding after the polypectomy, one       hemostatic clip was successfully placed (MR conditional). There was no       bleeding during, or at the end, of the procedure.      A 32 mm polyp was found in the transverse colon. The polyp was       multi-lobulated. Polypectomy was attempted, initially using a piecemeal       technique with a hot snare. Polyp resection was incomplete with this       device. This intervention then required a  different device and       polypectomy technique.  The polyp was removed with a lift and cut       technique using a hot snare. Resection and retrieval were complete. To       prevent bleeding after the polypectomy, four hemostatic clips were       successfully placed (MR conditional). There was no bleeding at the end       of the procedure. Area was tattooed with an injection of 2 mL of Spot       (carbon black). To prevent bleeding after the polypectomy, four       hemostatic clips were successfully placed (MR conditional). There was no       bleeding at the end of the procedure.      The exam was otherwise without abnormality.      A 9 mm polyp was found in the splenic flexure. The polyp was       semi-pedunculated. The polyp was removed with a hot snare. Resection and       retrieval were complete. Impression:            - Non-bleeding internal hemorrhoids.                        - Diverticulosis in the sigmoid colon.                        - Two 7 to 10 mm polyps in the transverse colon,                         removed with a hot snare. Resected and retrieved.                        - One 5 mm polyp in the transverse colon, removed with                         a jumbo cold forceps. Resected and retrieved.                        - Two 7 to 9 mm polyps in the descending colon,                         removed with a hot snare. Resected and retrieved.                        - One 12 mm polyp in the transverse colon, removed                         with a hot snare. Resected and retrieved. Clip (MR                         conditional) was placed.                        - One 32 mm polyp in the transverse colon, removed                         using lift and cut and a hot snare. Resected and  retrieved. Clips (MR conditional) were placed.                         Tattooed.                        - The examination was otherwise normal.                        - One 9 mm  polyp at the splenic flexure, removed with                         a hot snare. Resected and retrieved. Recommendation:        - Patient has a contact number available for                         emergencies. The signs and symptoms of potential                         delayed complications were discussed with the patient.                         Return to normal activities tomorrow. Written                         discharge instructions were provided to the patient.                        - Await pathology results from EGD, also performed                         today.                        - Use Protonix (pantoprazole) 40 mg PO daily.                        - Resume previous diet.                        - Resume Xarelto (rivaroxaban) at prior dose in 2                         days. Refer to managing physician for further                         adjustment of therapy.                        - Await pathology results.                        - Repeat colonoscopy is recommended for surveillance.                         The colonoscopy date will be determined after                         pathology results from today's exam become available  for review.                        - Return to GI office in 6 weeks.                        - Follow up with Stephens November, GI Nurse                         Practioner, in office to discuss results and monitor                         progress. Procedure Code(s):     --- Professional ---                        [phone], Colonoscopy, flexible; with removal of                         tumor(s), polyp(s), or other lesion(s) by snare                         technique                        45380, 9, Colonoscopy, flexible; with biopsy, single                         or multiple                        45381, Colonoscopy, flexible; with directed submucosal                         injection(s), any substance Diagnosis Code(s):     ---  Professional ---                        K57.30, Diverticulosis of large intestine without                         perforation or abscess without bleeding                        R19.5, Other fecal abnormalities                        K63.5, Polyp of colon                        K64.0, First degree hemorrhoids CPT copyright 2019 American Medical Association. All rights reserved. The codes documented in this report are preliminary and upon coder review may  be revised to meet current compliance requirements. Efrain Sella MD, MD 04/27/2020 9:33:30 AM This report has been signed electronically. Number of Addenda: 0 Note Initiated On: 04/27/2020 8:05 AM Scope Withdrawal Time: 0 hours 32 minutes 34 seconds  Total Procedure Duration: 0 hours 39 minutes 9 seconds  Estimated Blood Loss:  Estimated blood loss was minimal. Estimated blood loss                         was minimal. Estimated blood loss was minimal.  Estimated blood loss was minimal. Estimated blood loss                         was minimal.      [facility]

## 2020-04-27 NOTE — H&P (Signed)
  Outpatient short stay form Pre-procedure 04/27/2020 8:17 AM Derrick Reyes, M.D.  Primary Physician: Thereasa Distance, M.D.  Reason for visit:  Heme positive stool  History of present illness: Patient presents for hemoccult positive stool without change in bowel habits, rectal bleeding or weight loss. Takes Xarelto and has held that for 72 hrs in preparation for colonoscopy and upper endoscopy. Patient denies intractable heartburn, dysphagia, hemetemesis, abdominal pain, nausea or vomiting.     Current Facility-Administered Medications:  .  0.9 %  sodium chloride infusion, , Intravenous, Continuous, [place], Benay Pike, MD, Last Rate: 20 mL/hr at 04/27/20 0801, 1,000 mL at 04/27/20 0801 .  butamben-tetracaine-benzocaine (CETACAINE) 01-17-13 % spray, , , ,   Medications Prior to Admission  Medication Sig Dispense Refill Last Dose  . amLODipine (NORVASC) 10 MG tablet Take 10 mg by mouth daily.  3 04/27/2020 at Unknown time  . atorvastatin (LIPITOR) 10 MG tablet Take 10 mg by mouth daily at 6 PM.   6 04/27/2020 at Unknown time  . sertraline (ZOLOFT) 100 MG tablet Take 150 mg by mouth daily.   3 04/27/2020 at Unknown time  . Cinnamon 500 MG capsule Take 500 mg by mouth daily.      Marland Kitchen levocetirizine (XYZAL) 5 MG tablet Take 5 mg by mouth every evening.     Marland Kitchen RA KRILL OIL 500 MG CAPS Take 500 mg by mouth daily.      . Rivaroxaban 15 & 20 MG TBPK Take as directed on package: Start with one 15mg  tablet by mouth twice a day with food. On Day 22, switch to one 20mg  tablet once a day with food. 51 each 0 04/24/2020  . triamcinolone (NASACORT) 55 MCG/ACT AERO nasal inhaler Place 1 spray into the nose daily as needed (congestion or allergies).         Allergies  Allergen Reactions  . Losartan Hives  . Codeine Anxiety  . Lisinopril Anxiety     Past Medical History:  Diagnosis Date  . Allergic rhinitis   . Anxiety and depression   . BPH (benign prostatic hyperplasia)   . DVT (deep venous  thrombosis) ([place])   . Elevated PSA   . GERD (gastroesophageal reflux disease)   . HLD (hyperlipidemia)   . HLD (hyperlipidemia)   . HTN (hypertension)   . Peyronie's disease   . Sleep apnea     Review of systems:  Otherwise negative.    Physical Exam  Gen: Alert, oriented. Appears stated age.  HEENT: [place]/AT. PERRLA. Lungs: CTA, no wheezes. CV: RR nl S1, S2. Abd: soft, benign, no masses. BS+ Ext: No edema. Pulses 2+    Planned procedures: Proceed with EGD and colonoscopy. The patient understands the nature of the planned procedure, indications, risks, alternatives and potential complications including but not limited to bleeding, infection, perforation, damage to internal organs and possible oversedation/side effects from anesthesia. The patient agrees and gives consent to proceed.  Please refer to procedure notes for findings, recommendations and patient disposition/instructions.     Justus Duerr K. Alice Reyes, M.D. Gastroenterology 04/27/2020  8:17 AM    .to

## 2020-04-27 NOTE — Transfer of Care (Signed)
Immediate Anesthesia Transfer of Care Note  Patient: Derrick Reyes.  Procedure(s) Performed: ESOPHAGOGASTRODUODENOSCOPY (EGD) WITH PROPOFOL (N/A ) COLONOSCOPY WITH PROPOFOL (N/A )  Patient Location: PACU  Anesthesia Type:General  Level of Consciousness: awake and sedated  Airway & Oxygen Therapy: Patient Spontanous Breathing and Patient connected to face mask oxygen  Post-op Assessment: Report given to RN and Post -op Vital signs reviewed and stable  Post vital signs: Reviewed and stable  Last Vitals:  Vitals Value Taken Time  BP 116/72 04/27/20 0929  Temp 36.3 C 04/27/20 0928  Pulse 77 04/27/20 0931  Resp 23 04/27/20 0931  SpO2 99 % 04/27/20 0931  Vitals shown include unvalidated device data.  Last Pain:  Vitals:   04/27/20 0928  TempSrc: Temporal  PainSc: Asleep         Complications: No apparent anesthesia complications

## 2020-04-27 NOTE — Anesthesia Preprocedure Evaluation (Signed)
Anesthesia Evaluation  Patient identified by MRN, date of birth, ID band Patient awake    Reviewed: Allergy & Precautions, H&P , NPO status , Patient's Chart, lab work & pertinent test results, reviewed documented beta blocker date and time   History of Anesthesia Complications Negative for: history of anesthetic complications  Airway Mallampati: I  TM Distance: >3 FB Neck ROM: full    Dental  (+) Dental Advidsory Given, Caps, Teeth Intact   Pulmonary neg shortness of breath, sleep apnea (BiPAP) , neg COPD, neg recent URI, Current Smoker,    Pulmonary exam normal breath sounds clear to auscultation       Cardiovascular Exercise Tolerance: Good hypertension, (-) angina(-) Past MI and (-) Cardiac Stents Normal cardiovascular exam(-) dysrhythmias (-) Valvular Problems/Murmurs Rhythm:regular Rate:Normal     Neuro/Psych PSYCHIATRIC DISORDERS Anxiety Depression negative neurological ROS     GI/Hepatic Neg liver ROS, GERD  ,  Endo/Other  negative endocrine ROS  Renal/GU negative Renal ROS  negative genitourinary   Musculoskeletal   Abdominal   Peds  Hematology negative hematology ROS (+)   Anesthesia Other Findings Past Medical History: No date: Allergic rhinitis No date: Anxiety and depression No date: BPH (benign prostatic hyperplasia) No date: DVT (deep venous thrombosis) ([facility]) No date: Elevated PSA No date: GERD (gastroesophageal reflux disease) No date: HLD (hyperlipidemia) No date: HLD (hyperlipidemia) No date: HTN (hypertension) No date: Peyronie's disease No date: Sleep apnea   Reproductive/Obstetrics negative OB ROS                             Anesthesia Physical Anesthesia Plan  ASA: II  Anesthesia Plan: General   Post-op Pain Management:    Induction: Intravenous  PONV Risk Score and Plan: 1 and Propofol infusion and TIVA  Airway Management Planned: Natural Airway and  Nasal Cannula  Additional Equipment:   Intra-op Plan:   Post-operative Plan:   Informed Consent: I have reviewed the patients History and Physical, chart, labs and discussed the procedure including the risks, benefits and alternatives for the proposed anesthesia with the patient or authorized representative who has indicated his/her understanding and acceptance.     Dental Advisory Given  Plan Discussed with: Anesthesiologist, CRNA and Surgeon  Anesthesia Plan Comments:         Anesthesia Quick Evaluation

## 2020-04-27 NOTE — Interval H&P Note (Signed)
History and Physical Interval Note:  04/27/2020 8:19 AM  Derrick Reyes.  has presented today for surgery, with the diagnosis of HEME+ STOOL.  The various methods of treatment have been discussed with the patient and family. After consideration of risks, benefits and other options for treatment, the patient has consented to  Procedure(s): ESOPHAGOGASTRODUODENOSCOPY (EGD) WITH PROPOFOL (N/A) COLONOSCOPY WITH PROPOFOL (N/A) as a surgical intervention.  The patient's history has been reviewed, patient examined, no change in status, stable for surgery.  I have reviewed the patient's chart and labs.  Questions were answered to the patient's satisfaction.     [name], [name]

## 2020-04-28 ENCOUNTER — Encounter: Payer: Self-pay | Admitting: *Deleted

## 2020-04-28 LAB — SURGICAL PATHOLOGY

## 2020-04-28 NOTE — Anesthesia Postprocedure Evaluation (Signed)
Anesthesia Post Note  Patient: Derrick Reyes.  Procedure(s) Performed: ESOPHAGOGASTRODUODENOSCOPY (EGD) WITH PROPOFOL (N/A ) COLONOSCOPY WITH PROPOFOL (N/A )  Patient location during evaluation: Endoscopy Anesthesia Type: General Level of consciousness: awake and alert Pain management: pain level controlled Vital Signs Assessment: post-procedure vital signs reviewed and stable Respiratory status: spontaneous breathing, nonlabored ventilation, respiratory function stable and patient connected to nasal cannula oxygen Cardiovascular status: blood pressure returned to baseline and stable Postop Assessment: no apparent nausea or vomiting Anesthetic complications: no     Last Vitals:  Vitals:   04/27/20 0928 04/27/20 0948  BP: 116/72 134/86  Pulse: 79   Resp:    Temp: (!) 36.3 C   SpO2: 99%     Last Pain:  Vitals:   04/27/20 0958  TempSrc:   PainSc: 0-No pain                 Martha Clan

## 2020-09-26 ENCOUNTER — Other Ambulatory Visit: Payer: Self-pay

## 2020-09-26 ENCOUNTER — Other Ambulatory Visit
Admission: RE | Admit: 2020-09-26 | Discharge: 2020-09-26 | Disposition: A | Discharge status: Home / Self Care | Payer: Commercial Managed Care - PPO | Source: Ambulatory Visit | Attending: Internal Medicine | Admitting: Internal Medicine

## 2020-09-26 DIAGNOSIS — Z01812 Encounter for preprocedural laboratory examination: Secondary | ICD-10-CM | POA: Insufficient documentation

## 2020-09-26 DIAGNOSIS — Z20822 Contact with and (suspected) exposure to covid-19: Secondary | ICD-10-CM | POA: Diagnosis not present

## 2020-09-27 ENCOUNTER — Encounter: Payer: Self-pay | Admitting: Internal Medicine

## 2020-09-27 LAB — SARS CORONAVIRUS 2 (TAT 6-24 HRS): SARS Coronavirus 2: NEGATIVE

## 2020-09-28 ENCOUNTER — Encounter: Payer: Self-pay | Admitting: Internal Medicine

## 2020-09-28 ENCOUNTER — Ambulatory Visit: Payer: Commercial Managed Care - PPO | Admitting: Anesthesiology

## 2020-09-28 ENCOUNTER — Other Ambulatory Visit: Payer: Self-pay

## 2020-09-28 ENCOUNTER — Ambulatory Visit
Admission: RE | Admit: 2020-09-28 | Discharge: 2020-09-28 | Disposition: A | Discharge status: Home / Self Care | Payer: Commercial Managed Care - PPO | Attending: Internal Medicine | Admitting: Internal Medicine

## 2020-09-28 ENCOUNTER — Encounter
Admission: RE | Disposition: A | Discharge status: Home / Self Care | Payer: Self-pay | Source: Home / Self Care | Attending: Internal Medicine

## 2020-09-28 DIAGNOSIS — Z79899 Other long term (current) drug therapy: Secondary | ICD-10-CM | POA: Diagnosis not present

## 2020-09-28 DIAGNOSIS — K219 Gastro-esophageal reflux disease without esophagitis: Secondary | ICD-10-CM | POA: Diagnosis not present

## 2020-09-28 DIAGNOSIS — F32A Depression, unspecified: Secondary | ICD-10-CM | POA: Diagnosis not present

## 2020-09-28 DIAGNOSIS — E785 Hyperlipidemia, unspecified: Secondary | ICD-10-CM | POA: Insufficient documentation

## 2020-09-28 DIAGNOSIS — F419 Anxiety disorder, unspecified: Secondary | ICD-10-CM | POA: Insufficient documentation

## 2020-09-28 DIAGNOSIS — Z86010 Personal history of colonic polyps: Secondary | ICD-10-CM | POA: Diagnosis not present

## 2020-09-28 DIAGNOSIS — K635 Polyp of colon: Secondary | ICD-10-CM | POA: Diagnosis present

## 2020-09-28 DIAGNOSIS — D123 Benign neoplasm of transverse colon: Secondary | ICD-10-CM | POA: Diagnosis not present

## 2020-09-28 DIAGNOSIS — K640 First degree hemorrhoids: Secondary | ICD-10-CM | POA: Insufficient documentation

## 2020-09-28 DIAGNOSIS — G4730 Sleep apnea, unspecified: Secondary | ICD-10-CM | POA: Insufficient documentation

## 2020-09-28 DIAGNOSIS — D125 Benign neoplasm of sigmoid colon: Secondary | ICD-10-CM | POA: Diagnosis not present

## 2020-09-28 DIAGNOSIS — Z86718 Personal history of other venous thrombosis and embolism: Secondary | ICD-10-CM | POA: Diagnosis not present

## 2020-09-28 DIAGNOSIS — Z7901 Long term (current) use of anticoagulants: Secondary | ICD-10-CM | POA: Diagnosis not present

## 2020-09-28 DIAGNOSIS — F17200 Nicotine dependence, unspecified, uncomplicated: Secondary | ICD-10-CM | POA: Diagnosis not present

## 2020-09-28 DIAGNOSIS — D122 Benign neoplasm of ascending colon: Secondary | ICD-10-CM | POA: Diagnosis not present

## 2020-09-28 DIAGNOSIS — K5730 Diverticulosis of large intestine without perforation or abscess without bleeding: Secondary | ICD-10-CM | POA: Diagnosis not present

## 2020-09-28 DIAGNOSIS — J309 Allergic rhinitis, unspecified: Secondary | ICD-10-CM | POA: Insufficient documentation

## 2020-09-28 DIAGNOSIS — I10 Essential (primary) hypertension: Secondary | ICD-10-CM | POA: Insufficient documentation

## 2020-09-28 DIAGNOSIS — Z8546 Personal history of malignant neoplasm of prostate: Secondary | ICD-10-CM | POA: Diagnosis not present

## 2020-09-28 HISTORY — DX: Malignant (primary) neoplasm, unspecified: C80.1

## 2020-09-28 HISTORY — PX: COLONOSCOPY: SHX5424

## 2020-09-28 HISTORY — DX: Impaired fasting glucose: R73.01

## 2020-09-28 HISTORY — DX: Deviated nasal septum: J34.2

## 2020-09-28 SURGERY — COLONOSCOPY
Anesthesia: General

## 2020-09-28 MED ORDER — PROPOFOL 10 MG/ML IV BOLUS
INTRAVENOUS | Status: DC | PRN
  Administered 2020-09-28: 24 mg via INTRAVENOUS
  Administered 2020-09-28: 80 mg via INTRAVENOUS

## 2020-09-28 MED ORDER — LIDOCAINE HCL (CARDIAC) PF 100 MG/5ML IV SOSY
PREFILLED_SYRINGE | INTRAVENOUS | Status: DC | PRN
  Administered 2020-09-28: 50 mg via INTRAVENOUS

## 2020-09-28 MED ORDER — GLYCOPYRROLATE 0.2 MG/ML IJ SOLN
INTRAMUSCULAR | Status: AC
  Filled 2020-09-28: qty 1

## 2020-09-28 MED ORDER — LIDOCAINE HCL (PF) 2 % IJ SOLN
INTRAMUSCULAR | Status: AC
  Filled 2020-09-28: qty 5

## 2020-09-28 MED ORDER — PROPOFOL 500 MG/50ML IV EMUL
INTRAVENOUS | Status: DC | PRN
  Administered 2020-09-28: 135 ug/kg/min via INTRAVENOUS

## 2020-09-28 MED ORDER — ONDANSETRON HCL 4 MG/2ML IJ SOLN: 4.0000 mg | Freq: Once | INTRAMUSCULAR | Status: DC | PRN

## 2020-09-28 MED ORDER — SODIUM CHLORIDE 0.9 % IV SOLN: INTRAVENOUS | Status: DC

## 2020-09-28 MED ORDER — FENTANYL CITRATE (PF) 100 MCG/2ML IJ SOLN: 25.0000 ug | INTRAMUSCULAR | Status: DC | PRN

## 2020-09-28 MED ORDER — GLYCOPYRROLATE 0.2 MG/ML IJ SOLN
INTRAMUSCULAR | Status: DC | PRN
  Administered 2020-09-28: .2 mg via INTRAVENOUS

## 2020-09-28 MED ORDER — PROPOFOL 500 MG/50ML IV EMUL
INTRAVENOUS | Status: AC
  Filled 2020-09-28: qty 50

## 2020-09-28 NOTE — H&P (Signed)
Outpatient short stay form Pre-procedure 09/28/2020 8:27 AM Pamelia Botto K. Alice Reichert, M.D.  Primary Physician: Thereasa Distance, M.D.  Reason for visit:  Personal history of adenomatous colon polyps with dysplasia (May 2021)  History of present illness:                            Patient presents for colonoscopy for a personal hx of colon polyps. The patient denies abdominal pain, abnormal weight loss or rectal bleeding.      Current Facility-Administered Medications:  .  0.9 %  sodium chloride infusion, , Intravenous, Continuous, Lawarence Meek, Benay Pike, MD .  fentaNYL (SUBLIMAZE) injection 25 mcg, 25 mcg, Intravenous, Q5 min PRN, Alvin Critchley, MD .  ondansetron [facility]) injection 4 mg, 4 mg, Intravenous, Once PRN, Alvin Critchley, MD  Medications Prior to Admission  Medication Sig Dispense Refill Last Dose  . amLODipine (NORVASC) 10 MG tablet Take 10 mg by mouth daily.  3 09/28/2020 at Unknown time  . atorvastatin (LIPITOR) 10 MG tablet Take 10 mg by mouth daily at 6 PM.   6 09/28/2020 at Unknown time  . Cholecalciferol (VITAMIN D3 PO) Take 2,000 Units by mouth daily.   09/25/20  . Cinnamon 500 MG capsule Take 500 mg by mouth daily.    09/27/2020 at Unknown time  . pantoprazole (PROTONIX) 40 MG tablet Take 40 mg by mouth daily.   09/28/2020 at Unknown time  . sertraline (ZOLOFT) 100 MG tablet Take 150 mg by mouth daily.   3 09/28/2020 at Unknown time  . TRI-MIX 150-5-50 MG-MG-MCG SOLR Inject 30 mg as directed.     . vitamin B-12 (CYANOCOBALAMIN) 1000 MCG tablet Take 1,000 mcg by mouth daily.   09/25/20  . levocetirizine (XYZAL) 5 MG tablet Take 5 mg by mouth every evening.   09/25/20  . RA KRILL OIL 500 MG CAPS Take 500 mg by mouth daily.      . Rivaroxaban 15 & 20 MG TBPK Take as directed on package: Start with one 15mg  tablet by mouth twice a day with food. On Day 22, switch to one 20mg  tablet once a day with food. 51 each 0 09/24/20  . triamcinolone (NASACORT) 55 MCG/ACT AERO nasal inhaler Place  1 spray into the nose daily as needed (congestion or allergies).         Allergies  Allergen Reactions  . Losartan Hives  . Codeine Anxiety  . Lisinopril Anxiety     Past Medical History:  Diagnosis Date  . Allergic rhinitis   . Anxiety and depression   . BPH (benign prostatic hyperplasia)   . Cancer [facility])    prostate  . Deviated septum   . DVT (deep venous thrombosis) ([place])   . Elevated fasting glucose   . Elevated PSA   . GERD (gastroesophageal reflux disease)   . HLD (hyperlipidemia)   . HLD (hyperlipidemia)   . HTN (hypertension)   . Peyronie's disease   . Sleep apnea    uses bipap    Review of systems:  Otherwise negative.    Physical Exam  Gen: Alert, oriented. Appears stated age.  HEENT: [place]/AT. PERRLA. Lungs: CTA, no wheezes. CV: RR nl S1, S2. Abd: soft, benign, no masses. BS+ Ext: No edema. Pulses 2+    Planned procedures: Proceed with colonoscopy. The patient understands the nature of the planned procedure, indications, risks, alternatives and potential complications including but not limited to bleeding, infection, perforation, damage to internal organs and  possible oversedation/side effects from anesthesia. The patient agrees and gives consent to proceed.  Please refer to procedure notes for findings, recommendations and patient disposition/instructions.     Akif Weldy K. Alice Reichert, M.D. Gastroenterology 09/28/2020  8:27 AM

## 2020-09-28 NOTE — Transfer of Care (Signed)
Immediate Anesthesia Transfer of Care Note  Patient: Jamespaul Secrist.  Procedure(s) Performed: COLONOSCOPY (N/A )  Patient Location: PACU and Endoscopy Unit  Anesthesia Type:General  Level of Consciousness: drowsy  Airway & Oxygen Therapy: Patient Spontanous Breathing and Patient connected to nasal cannula oxygen  Post-op Assessment: Report given to RN and Post -op Vital signs reviewed and stable  Post vital signs: Reviewed and stable  Last Vitals:  Vitals Value Taken Time  BP 125/77 09/28/20 0938  Temp    Pulse 81 09/28/20 0938  Resp 20 09/28/20 0938  SpO2 93 % 09/28/20 0938  Vitals shown include unvalidated device data.  Last Pain:  Vitals:   09/28/20 0750  TempSrc: Temporal         Complications: No complications documented.

## 2020-09-28 NOTE — Op Note (Addendum)
[facility] Gastroenterology Patient Name: Derrick Reyes Procedure Date: 09/28/2020 8:28 AM MRN: [id number] Account #: [id number] Date of Birth: 22-Aug-1956 Admit Type: Outpatient Age: 64 Room: [facility] ENDO ROOM 4 Gender: Male Note Status: Finalized Procedure:             Colonoscopy Indications:           Last colonoscopy: May 2021, Follow-up for history of                         adenomatous polyps in the colon, Incomplete resection                         of transverse colon polyp Apr 27, 2020 Providers:             Benay Pike. Alice Reichert MD, MD Referring MD:          Sofie Hartigan (Referring MD) Medicines:             Propofol per Anesthesia Complications:         No immediate complications. Estimated blood loss:                         Minimal. Procedure:             Pre-Anesthesia Assessment:                        - The risks and benefits of the procedure and the                         sedation options and risks were discussed with the                         patient. All questions were answered and informed                         consent was obtained.                        - Patient identification and proposed procedure were                         verified prior to the procedure by the nurse. The                         procedure was verified in the procedure room.                        - ASA Grade Assessment: III - A patient with severe                         systemic disease.                        - After reviewing the risks and benefits, the patient                         was deemed in satisfactory condition to undergo the  procedure.                        - The risks and benefits of the procedure and the                         sedation options and risks were discussed with the                         patient. All questions were answered and informed                         consent was obtained.                        -  Patient identification and proposed procedure were                         verified prior to the procedure by the nurse. The                         procedure was verified in the procedure room.                        - ASA Grade Assessment: III - A patient with severe                         systemic disease.                        - After reviewing the risks and benefits, the patient                         was deemed in satisfactory condition to undergo the                         procedure.                        After obtaining informed consent, the colonoscope was                         passed under direct vision. Throughout the procedure,                         the patient's blood pressure, pulse, and oxygen                         saturations were monitored continuously. The                         Colonoscope was introduced through the anus and                         advanced to the the cecum, identified by appendiceal                         orifice and ileocecal valve. The colonoscopy was  performed without difficulty. The patient tolerated                         the procedure well. The quality of the bowel                         preparation was adequate. The ileocecal valve,                         appendiceal orifice, and rectum were photographed. Findings:      The perianal and digital rectal examinations were normal. Pertinent       negatives include normal sphincter tone and no palpable rectal lesions.      Multiple small and large-mouthed diverticula were found in the left       colon.      A 8 mm polyp was found in the sigmoid colon. The polyp was       semi-pedunculated. The polyp was removed with a hot snare. Resection and       retrieval were complete.      A 4 mm polyp was found in the ascending colon. The polyp was sessile.       The polyp was removed with a jumbo cold forceps. Resection and retrieval       were complete.      A 5 mm  polyp was found in the distal transverse colon. The polyp was       sessile. The polyp was removed with a jumbo cold forceps. Resection and       retrieval were complete.      A tattoo was seen in the transverse colon. The site showed scarring with       large amount of residual polyp tissue. Three biopsies were obtained with       cold forceps for histology in a targeted manner in the postpolyp site in       the transverse colon.      At this point more polyp tissue was noted around the area of the clip.       Polypectomy was attempted with a piecemeal technique using a hot snare.       No tissue was resected. Polyp resection was unsuccessful due to the       polypectomy being technically difficult and complex. Area was       unsuccessfully injected with 4 mL Eleview for lesion assessment, but the       lesion could not be lifted adequately. To prevent bleeding after the       polypectomy, four hemostatic clips were successfully placed (MR       conditional). There was no bleeding at the end of the procedure. Area       was tattooed with an injection of 4 mL of [place] ink.      Non-bleeding internal hemorrhoids were found during retroflexion. The       hemorrhoids were Grade I (internal hemorrhoids that do not prolapse).      The exam was otherwise without abnormality. Impression:            - Diverticulosis in the left colon.                        - One 8 mm polyp in the sigmoid colon, removed with a  hot snare. Resected and retrieved.                        - One 4 mm polyp in the ascending colon, removed with                         a jumbo cold forceps. Resected and retrieved.                        - One 5 mm polyp in the distal transverse colon,                         removed with a jumbo cold forceps. Resected and                         retrieved.                        - A tattoo was seen in the transverse colon. The site                         showed  scarring with large amount of residual polyp                         tissue.                        - Non-bleeding internal hemorrhoids.                        - The examination was otherwise normal.                        - Three biopsies were obtained in the postpolyp site                         in the transverse colon. Recommendation:        - Patient has a contact number available for                         emergencies. The signs and symptoms of potential                         delayed complications were discussed with the patient.                         Return to normal activities tomorrow. Written                         discharge instructions were provided to the patient.                        - Clear liquid diet for 1 day.                        - Continue present medications.                        - No aspirin, ibuprofen, naproxen, or other  non-steroidal anti-inflammatory drugs for 7 days after                         biopsy.                        - Await pathology results.                        - Refer to a surgeon at appointment to be scheduled.                        - Patient will likely require segmental colectomy of                         the transverse colon polyp site due to incomplete                         resection of the polyp at this site. Area was again                         tattooed.                        - Return to my office PRN.                        - The findings and recommendations were discussed with                         the patient. Procedure Code(s):     --- Professional ---                        [phone], Colonoscopy, flexible; with removal of                         tumor(s), polyp(s), or other lesion(s) by snare                         technique                        45380, 79, Colonoscopy, flexible; with biopsy, single                         or multiple                        45381, Colonoscopy, flexible;  with directed submucosal                         injection(s), any substance Diagnosis Code(s):     --- Professional ---                        K57.30, Diverticulosis of large intestine without                         perforation or abscess without bleeding                        Z86.010, Personal history  of colonic polyps                        K63.5, Polyp of colon                        K64.0, First degree hemorrhoids CPT copyright 2019 American Medical Association. All rights reserved. The codes documented in this report are preliminary and upon coder review may  be revised to meet current compliance requirements. Attending Participation:      I personally performed the entire procedure. Efrain Sella MD, MD 09/28/2020 9:51:07 AM This report has been signed electronically. Number of Addenda: 0 Note Initiated On: 09/28/2020 8:28 AM Scope Withdrawal Time: 0 hours 42 minutes 40 seconds  Total Procedure Duration: 0 hours 58 minutes 41 seconds  Estimated Blood Loss:  Estimated blood loss was minimal.      [facility]

## 2020-09-28 NOTE — Interval H&P Note (Signed)
History and Physical Interval Note:  09/28/2020 8:28 AM  Derrick Reyes.  has presented today for surgery, with the diagnosis of PERSONAL HX.OF COLON POLYPS.  The various methods of treatment have been discussed with the patient and family. After consideration of risks, benefits and other options for treatment, the patient has consented to  Procedure(s): COLONOSCOPY (N/A) as a surgical intervention.  The patient's history has been reviewed, patient examined, no change in status, stable for surgery.  I have reviewed the patient's chart and labs.  Questions were answered to the patient's satisfaction.     [place], [place]

## 2020-09-28 NOTE — Anesthesia Preprocedure Evaluation (Signed)
Anesthesia Evaluation  Patient identified by MRN, date of birth, ID band Patient awake    Reviewed: Allergy & Precautions, H&P , NPO status , Patient's Chart, lab work & pertinent test results, reviewed documented beta blocker date and time   History of Anesthesia Complications Negative for: history of anesthetic complications  Airway Mallampati: I  TM Distance: >3 FB Neck ROM: full    Dental  (+) Dental Advidsory Given, Caps, Teeth Intact   Pulmonary neg shortness of breath, sleep apnea (BiPAP) , neg COPD, neg recent URI, Current Smoker and Patient abstained from smoking.,    Pulmonary exam normal breath sounds clear to auscultation       Cardiovascular Exercise Tolerance: Good hypertension, (-) angina+ DVT  (-) Past MI and (-) Cardiac Stents Normal cardiovascular exam(-) dysrhythmias (-) Valvular Problems/Murmurs Rhythm:regular Rate:Normal     Neuro/Psych PSYCHIATRIC DISORDERS Anxiety Depression negative neurological ROS     GI/Hepatic Neg liver ROS, GERD  ,  Endo/Other  negative endocrine ROS  Renal/GU negative Renal ROS  negative genitourinary   Musculoskeletal   Abdominal   Peds  Hematology negative hematology ROS (+)   Anesthesia Other Findings Past Medical History: No date: Allergic rhinitis No date: Anxiety and depression No date: BPH (benign prostatic hyperplasia) No date: DVT (deep venous thrombosis) (HCC) No date: Elevated PSA No date: GERD (gastroesophageal reflux disease) No date: HLD (hyperlipidemia) No date: HLD (hyperlipidemia) No date: HTN (hypertension) No date: Peyronie's disease No date: Sleep apnea   Reproductive/Obstetrics negative OB ROS                             Anesthesia Physical  Anesthesia Plan  ASA: III  Anesthesia Plan: General   Post-op Pain Management:    Induction: Intravenous  PONV Risk Score and Plan: 1 and Propofol infusion and  TIVA  Airway Management Planned: Natural Airway and Nasal Cannula  Additional Equipment:   Intra-op Plan:   Post-operative Plan:   Informed Consent: I have reviewed the patients History and Physical, chart, labs and discussed the procedure including the risks, benefits and alternatives for the proposed anesthesia with the patient or authorized representative who has indicated his/her understanding and acceptance.     Dental Advisory Given  Plan Discussed with: Anesthesiologist, CRNA and Surgeon  Anesthesia Plan Comments:         Anesthesia Quick Evaluation

## 2020-09-29 ENCOUNTER — Encounter: Payer: Self-pay | Admitting: Internal Medicine

## 2020-09-29 LAB — SURGICAL PATHOLOGY

## 2020-10-03 NOTE — Anesthesia Postprocedure Evaluation (Signed)
Anesthesia Post Note  Patient: Derrick Reyes.  Procedure(s) Performed: COLONOSCOPY (N/A )  Patient location during evaluation: Endoscopy Anesthesia Type: General Level of consciousness: awake and alert and oriented Pain management: pain level controlled Vital Signs Assessment: post-procedure vital signs reviewed and stable Respiratory status: spontaneous breathing Cardiovascular status: blood pressure returned to baseline Anesthetic complications: no   No complications documented.   Last Vitals:  Vitals:   09/28/20 1007 09/28/20 1017  BP: 137/88 (!) 144/80  Pulse: 75 75  Resp: 13 15  Temp:    SpO2: 97% 98%    Last Pain:  Vitals:   09/28/20 1017  TempSrc:   PainSc: 0-No pain                 Marcel Sorter

## 2024-05-24 ENCOUNTER — Ambulatory Visit: Payer: Self-pay | Admitting: Anesthesiology

## 2024-05-24 ENCOUNTER — Encounter
Admission: RE | Disposition: A | Discharge status: Home / Self Care | Payer: Self-pay | Source: Home / Self Care | Attending: Gastroenterology

## 2024-05-24 ENCOUNTER — Ambulatory Visit
Admission: RE | Admit: 2024-05-24 | Discharge: 2024-05-24 | Disposition: A | Discharge status: Home / Self Care | Attending: Gastroenterology | Admitting: Gastroenterology

## 2024-05-24 DIAGNOSIS — K5520 Angiodysplasia of colon without hemorrhage: Secondary | ICD-10-CM | POA: Insufficient documentation

## 2024-05-24 DIAGNOSIS — K5730 Diverticulosis of large intestine without perforation or abscess without bleeding: Secondary | ICD-10-CM | POA: Diagnosis not present

## 2024-05-24 DIAGNOSIS — D123 Benign neoplasm of transverse colon: Secondary | ICD-10-CM | POA: Diagnosis not present

## 2024-05-24 DIAGNOSIS — G4730 Sleep apnea, unspecified: Secondary | ICD-10-CM | POA: Insufficient documentation

## 2024-05-24 DIAGNOSIS — Z1211 Encounter for screening for malignant neoplasm of colon: Secondary | ICD-10-CM | POA: Diagnosis present

## 2024-05-24 DIAGNOSIS — F17200 Nicotine dependence, unspecified, uncomplicated: Secondary | ICD-10-CM | POA: Diagnosis not present

## 2024-05-24 DIAGNOSIS — D122 Benign neoplasm of ascending colon: Secondary | ICD-10-CM | POA: Insufficient documentation

## 2024-05-24 DIAGNOSIS — I10 Essential (primary) hypertension: Secondary | ICD-10-CM | POA: Diagnosis not present

## 2024-05-24 DIAGNOSIS — D120 Benign neoplasm of cecum: Secondary | ICD-10-CM | POA: Insufficient documentation

## 2024-05-24 DIAGNOSIS — K219 Gastro-esophageal reflux disease without esophagitis: Secondary | ICD-10-CM | POA: Diagnosis not present

## 2024-05-24 DIAGNOSIS — Z86718 Personal history of other venous thrombosis and embolism: Secondary | ICD-10-CM | POA: Insufficient documentation

## 2024-05-24 DIAGNOSIS — Z7901 Long term (current) use of anticoagulants: Secondary | ICD-10-CM | POA: Insufficient documentation

## 2024-05-24 DIAGNOSIS — K641 Second degree hemorrhoids: Secondary | ICD-10-CM | POA: Insufficient documentation

## 2024-05-24 HISTORY — PX: COLONOSCOPY: SHX5424

## 2024-05-24 SURGERY — COLONOSCOPY
Anesthesia: General

## 2024-05-24 MED ORDER — EPHEDRINE 5 MG/ML INJ
INTRAVENOUS | Status: AC
Start: 2024-05-24 — End: ?
  Filled 2024-05-24: qty 5

## 2024-05-24 MED ORDER — SODIUM CHLORIDE 0.9 % IV SOLN
INTRAVENOUS | Status: DC
  Administered 2024-05-24: 500 mL via INTRAVENOUS

## 2024-05-24 MED ORDER — EPHEDRINE SULFATE-NACL 50-0.9 MG/10ML-% IV SOSY
PREFILLED_SYRINGE | INTRAVENOUS | Status: DC | PRN
  Administered 2024-05-24 (×2): 5 mg via INTRAVENOUS

## 2024-05-24 MED ORDER — PROPOFOL 1000 MG/100ML IV EMUL
INTRAVENOUS | Status: AC
  Filled 2024-05-24: qty 100

## 2024-05-24 MED ORDER — PROPOFOL 500 MG/50ML IV EMUL
INTRAVENOUS | Status: DC | PRN
  Administered 2024-05-24: 185 ug/kg/min via INTRAVENOUS

## 2024-05-24 MED ORDER — LIDOCAINE HCL (CARDIAC) PF 100 MG/5ML IV SOSY
PREFILLED_SYRINGE | INTRAVENOUS | Status: DC | PRN
  Administered 2024-05-24: 60 mg via INTRAVENOUS

## 2024-05-24 MED ORDER — PROPOFOL 10 MG/ML IV BOLUS
INTRAVENOUS | Status: DC | PRN
  Administered 2024-05-24: 120 mg via INTRAVENOUS

## 2024-05-24 MED ORDER — LIDOCAINE HCL (PF) 2 % IJ SOLN
INTRAMUSCULAR | Status: AC
Start: 2024-05-24 — End: ?
  Filled 2024-05-24: qty 5

## 2024-05-24 NOTE — Op Note (Signed)
 [facility] Gastroenterology Patient Name: Derrick Reyes Procedure Date: 05/24/2024 8:22 AM MRN: [id number] Account #: [id number] Date of Birth: 1956/05/30 Admit Type: Outpatient Age: 68 Room: [facility] ENDO ROOM 3 Gender: Male Note Status: Finalized Instrument Name: Charlyn Cooley [id number] Procedure:             Colonoscopy Indications:           Surveillance: Personal history of adenomatous polyps                         on last colonoscopy > 3 years ago Providers:             Leida Puna MD, MD Referring MD:          Leida Puna MD, MD (Referring MD), Lorrie Rothman (Referring MD) Medicines:             Monitored Anesthesia Care Complications:         No immediate complications. Estimated blood loss:                         Minimal. Procedure:             Pre-Anesthesia Assessment:                        - Prior to the procedure, a History and Physical was                         performed, and patient medications and allergies were                         reviewed. The patient is competent. The risks and                         benefits of the procedure and the sedation options and                         risks were discussed with the patient. All questions                         were answered and informed consent was obtained.                         Patient identification and proposed procedure were                         verified by the physician, the nurse, the                         anesthesiologist, the anesthetist and the technician                         in the endoscopy suite. Mental Status Examination:                         alert and oriented. Airway Examination: normal  oropharyngeal airway and neck mobility. Respiratory                         Examination: clear to auscultation. CV Examination:                         normal. Prophylactic Antibiotics: The patient does not                          require prophylactic antibiotics. Prior                         Anticoagulants: The patient has taken Xarelto                          (rivaroxaban ), last dose was 4 days prior to                         procedure. ASA Grade Assessment: III - A patient with                         severe systemic disease. After reviewing the risks and                         benefits, the patient was deemed in satisfactory                         condition to undergo the procedure. The anesthesia                         plan was to use monitored anesthesia care (MAC).                         Immediately prior to administration of medications,                         the patient was re-assessed for adequacy to receive                         sedatives. The heart rate, respiratory rate, oxygen                         saturations, blood pressure, adequacy of pulmonary                         ventilation, and response to care were monitored                         throughout the procedure. The physical status of the                         patient was re-assessed after the procedure.                        After obtaining informed consent, the colonoscope was                         passed under direct vision. Throughout the procedure,  the patient's blood pressure, pulse, and oxygen                         saturations were monitored continuously. The                         Colonoscope was introduced through the anus and                         advanced to the the cecum, identified by appendiceal                         orifice and ileocecal valve. The colonoscopy was                         performed without difficulty. The patient tolerated                         the procedure well. The quality of the bowel                         preparation was adequate to identify polyps. The                         ileocecal valve, appendiceal orifice, and rectum were                          photographed. Findings:      The perianal and digital rectal examinations were normal.      A 4 mm polyp was found in the cecum. The polyp was sessile. The polyp       was removed with a cold snare. Resection and retrieval were complete.       Estimated blood loss was minimal.      Two sessile polyps were found in the ascending colon. The polyps were 4       to 5 mm in size. These polyps were removed with a cold snare. Resection       and retrieval were complete. Estimated blood loss was minimal.      A 4 mm polyp was found in the transverse colon. The polyp was sessile.       The polyp was removed with a cold snare. Resection and retrieval were       complete. Estimated blood loss was minimal.      A 12 mm polyp was found in the transverse colon. The polyp was       semi-pedunculated. The polyp was removed with a hot snare. Resection and       retrieval were complete. To prevent bleeding after the polypectomy, one       hemostatic clip was successfully placed. There was no bleeding during,       or at the end, of the procedure.      Five sessile polyps were found in the distal transverse colon. The       polyps were 2 to 8 mm in size. These polyps were removed with a cold       snare. Resection and retrieval were complete. Estimated blood loss was       minimal.      A tattoo was seen in the distal transverse colon.  There was a medium-sized lipoma, in the distal transverse colon.      There was a medium-sized lipoma, in the descending colon.      Multiple large-mouthed and small-mouthed diverticula were found in the       sigmoid colon.      A few small patchy angioectasias without bleeding were found at the anus.      Internal hemorrhoids were found during retroflexion. The hemorrhoids       were Grade II (internal hemorrhoids that prolapse but reduce       spontaneously).      The exam was otherwise without abnormality on direct and retroflexion       views. Impression:             - One 4 mm polyp in the cecum, removed with a cold                         snare. Resected and retrieved.                        - Two 4 to 5 mm polyps in the ascending colon, removed                         with a cold snare. Resected and retrieved.                        - One 4 mm polyp in the transverse colon, removed with                         a cold snare. Resected and retrieved.                        - One 12 mm polyp in the transverse colon, removed                         with a hot snare. Resected and retrieved. Clip was                         placed.                        - Five 2 to 8 mm polyps in the distal transverse                         colon, removed with a cold snare. Resected and                         retrieved.                        - A tattoo was seen in the distal transverse colon.                        - Medium-sized lipoma in the distal transverse colon.                        - Medium-sized lipoma in the descending colon.                        -  Diverticulosis in the sigmoid colon.                        - A few non-bleeding colonic angioectasias.                        - Internal hemorrhoids.                        - The examination was otherwise normal on direct and                         retroflexion views. Recommendation:        - Discharge patient to home.                        - Resume previous diet.                        - Resume Xarelto  (rivaroxaban ) at prior dose in 2 days.                        - Await pathology results.                        - Repeat colonoscopy in 1 year for surveillance.                        - Return to referring physician as previously                         scheduled.                        - Refer to a Runner, broadcasting/film/video. Procedure Code(s):     --- Professional ---                        [phone], Colonoscopy, flexible; with removal of                         tumor(s), polyp(s), or other lesion(s) by snare                          technique Diagnosis Code(s):     --- Professional ---                        Z86.010, Personal history of colonic polyps                        D12.0, Benign neoplasm of cecum                        D12.3, Benign neoplasm of transverse colon (hepatic                         flexure or splenic flexure)                        D12.2, Benign neoplasm of ascending colon  K64.1, Second degree hemorrhoids                        D17.5, Benign lipomatous neoplasm of intra-abdominal                         organs                        K55.20, Angiodysplasia of colon without hemorrhage                        K57.30, Diverticulosis of large intestine without                         perforation or abscess without bleeding CPT copyright 2022 American Medical Association. All rights reserved. The codes documented in this report are preliminary and upon coder review may  be revised to meet current compliance requirements. Leida Puna MD, MD 05/24/2024 9:10:43 AM Number of Addenda: 0 Note Initiated On: 05/24/2024 8:22 AM Scope Withdrawal Time: 0 hours 16 minutes 58 seconds  Total Procedure Duration: 0 hours 31 minutes 35 seconds  Estimated Blood Loss:  Estimated blood loss was minimal.      [facility]

## 2024-05-24 NOTE — Anesthesia Preprocedure Evaluation (Signed)
 Anesthesia Evaluation  Patient identified by MRN, date of birth, ID band Patient awake    Reviewed: Allergy & Precautions, H&P , NPO status , Patient's Chart, lab work & pertinent test results, reviewed documented beta blocker date and time   History of Anesthesia Complications Negative for: history of anesthetic complications  Airway Mallampati: I  TM Distance: >3 FB Neck ROM: full    Dental  (+) Dental Advidsory Given, Caps, Teeth Intact   Pulmonary neg shortness of breath, sleep apnea (BiPAP) , neg COPD, neg recent URI, Current Smoker and Patient abstained from smoking.   Pulmonary exam normal breath sounds clear to auscultation       Cardiovascular Exercise Tolerance: Good hypertension, (-) angina + DVT  (-) Past MI and (-) Cardiac Stents Normal cardiovascular exam(-) dysrhythmias (-) Valvular Problems/Murmurs Rhythm:regular Rate:Normal     Neuro/Psych  PSYCHIATRIC DISORDERS Anxiety Depression    negative neurological ROS     GI/Hepatic Neg liver ROS,GERD  ,,  Endo/Other  negative endocrine ROS    Renal/GU      Musculoskeletal   Abdominal   Peds  Hematology negative hematology ROS (+)   Anesthesia Other Findings Past Medical History: No date: Allergic rhinitis No date: Anxiety and depression No date: BPH (benign prostatic hyperplasia) No date: DVT (deep venous thrombosis) (HCC) No date: Elevated PSA No date: GERD (gastroesophageal reflux disease) No date: HLD (hyperlipidemia) No date: HLD (hyperlipidemia) No date: HTN (hypertension) No date: Peyronie's disease No date: Sleep apnea   Reproductive/Obstetrics negative OB ROS                             Anesthesia Physical Anesthesia Plan  ASA: 3  Anesthesia Plan: General   Post-op Pain Management: Minimal or no pain anticipated   Induction: Intravenous  PONV Risk Score and Plan: 3 and Propofol  infusion, TIVA and  Ondansetron   Airway Management Planned: Nasal Cannula  Additional Equipment: None  Intra-op Plan:   Post-operative Plan:   Informed Consent: I have reviewed the patients History and Physical, chart, labs and discussed the procedure including the risks, benefits and alternatives for the proposed anesthesia with the patient or authorized representative who has indicated his/her understanding and acceptance.     Dental advisory given  Plan Discussed with: CRNA and Surgeon  Anesthesia Plan Comments: (Discussed risks of anesthesia with patient, including possibility of difficulty with spontaneous ventilation under anesthesia necessitating airway intervention, PONV, and rare risks such as cardiac or respiratory or neurological events, and allergic reactions. Discussed the role of CRNA in patient's perioperative care. Patient understands.)       Anesthesia Quick Evaluation

## 2024-05-24 NOTE — Transfer of Care (Signed)
 Immediate Anesthesia Transfer of Care Note  Patient: Derrick Reyes.  Procedure(s) Performed: COLONOSCOPY  Patient Location: Endoscopy Unit  Anesthesia Type:General  Level of Consciousness: drowsy  Airway & Oxygen Therapy: Patient Spontanous Breathing and Patient connected to face mask oxygen  Post-op Assessment: Report given to RN and Post -op Vital signs reviewed and stable  Post vital signs: Reviewed and stable  Last Vitals:  Vitals Value Taken Time  BP 100/66 05/24/24 0907  Temp 97.1   Pulse 64 05/24/24 0907  Resp 11 05/24/24 0907  SpO2 97 % 05/24/24 0907  Vitals shown include unfiled device data.  Last Pain:  Vitals:   05/24/24 0703  TempSrc: Temporal  PainSc: 0-No pain         Complications: No notable events documented.

## 2024-05-24 NOTE — Anesthesia Postprocedure Evaluation (Signed)
 Anesthesia Post Note  Patient: Derrick Reyes.  Procedure(s) Performed: COLONOSCOPY  Patient location during evaluation: PACU Anesthesia Type: General Level of consciousness: awake and alert Pain management: pain level controlled Vital Signs Assessment: post-procedure vital signs reviewed and stable Respiratory status: spontaneous breathing, nonlabored ventilation, respiratory function stable and patient connected to nasal cannula oxygen Cardiovascular status: blood pressure returned to baseline and stable Postop Assessment: no apparent nausea or vomiting Anesthetic complications: no  No notable events documented.   Last Vitals:  Vitals:   05/24/24 0917 05/24/24 0927  BP: 129/79 (!) 142/91  Pulse: 71 67  Resp: 16 17  Temp:    SpO2: 97% 97%    Last Pain:  Vitals:   05/24/24 0927  TempSrc:   PainSc: 0-No pain                 Enrique Harvest

## 2024-05-24 NOTE — Interval H&P Note (Signed)
 History and Physical Interval Note:  05/24/2024 8:22 AM  Derrick Reyes.  has presented today for surgery, with the diagnosis of PH Polyps.  The various methods of treatment have been discussed with the patient and family. After consideration of risks, benefits and other options for treatment, the patient has consented to  Procedure(s): COLONOSCOPY (N/A) as a surgical intervention.  The patient's history has been reviewed, patient examined, no change in status, stable for surgery.  I have reviewed the patient's chart and labs.  Questions were answered to the patient's satisfaction.     Shane Darling  Ok to proceed with colonoscopy

## 2024-05-24 NOTE — H&P (Signed)
 Outpatient short stay form Pre-procedure 05/24/2024  Shane Darling, MD  Primary Physician: Lorrie Rothman, MD  Reason for visit:  Surveillance  History of present illness:    68 y/o gentleman with history of hypertension, prostate cancer s/p prostatectomy and radiation, and PE in 2020 on chronic DOAC with last dose 4 days ago here for surveillance colonoscopy. Has history of large polyp removed. Last colonoscopy with fibrotic tissue at tattoo site. No family history of GI malignancies.    Current Facility-Administered Medications:    0.9 %  sodium chloride  infusion, , Intravenous, Continuous, Cherylynn Liszewski, Leanora Prophet, MD, Last Rate: 20 mL/hr at 05/24/24 0753, Continued from Pre-op at 05/24/24 0753  Medications Prior to Admission  Medication Sig Dispense Refill Last Dose/Taking   amLODipine  (NORVASC ) 10 MG tablet Take 10 mg by mouth daily.  3 05/23/2024   atorvastatin  (LIPITOR) 10 MG tablet Take 10 mg by mouth daily at 6 PM.   6 Past Week   Cholecalciferol (VITAMIN D3 PO) Take 2,000 Units by mouth daily.   Past Week   Cinnamon 500 MG capsule Take 500 mg by mouth daily.    Past Week   levocetirizine (XYZAL ) 5 MG tablet Take 5 mg by mouth every evening.   Past Week   pantoprazole (PROTONIX) 40 MG tablet Take 40 mg by mouth daily.   05/23/2024   RA KRILL OIL 500 MG CAPS Take 500 mg by mouth daily.    Past Week   sertraline  (ZOLOFT ) 100 MG tablet Take 150 mg by mouth daily.   3 05/23/2024   TRI-MIX 150-5-50 MG-MG-MCG SOLR Inject 30 mg as directed.   05/23/2024   triamcinolone  (NASACORT ) 55 MCG/ACT AERO nasal inhaler Place 1 spray into the nose daily as needed (congestion or allergies).    Past Week   vitamin B-12 (CYANOCOBALAMIN) 1000 MCG tablet Take 1,000 mcg by mouth daily.   Past Week   Rivaroxaban  15 & 20 MG TBPK Take as directed on package: Start with one 15mg  tablet by mouth twice a day with food. On Day 22, switch to one 20mg  tablet once a day with food. 51 each 0      Allergies   Allergen Reactions   Losartan Hives   Codeine Anxiety   Lisinopril Anxiety     Past Medical History:  Diagnosis Date   Allergic rhinitis    Anxiety and depression    BPH (benign prostatic hyperplasia)    Cancer (HCC)    prostate   Deviated septum    DVT (deep venous thrombosis) (HCC)    Elevated fasting glucose    Elevated PSA    GERD (gastroesophageal reflux disease)    HLD (hyperlipidemia)    HLD (hyperlipidemia)    HTN (hypertension)    Peyronie's disease    Sleep apnea    uses bipap    Review of systems:  Otherwise negative.    Physical Exam  Gen: Alert, oriented. Appears stated age.  HEENT: PERRLA. Lungs: No respiratory distress CV: RRR Abd: soft, benign, no masses Ext: No edema    Planned procedures: Proceed with colonoscopy. The patient understands the nature of the planned procedure, indications, risks, alternatives and potential complications including but not limited to bleeding, infection, perforation, damage to internal organs and possible oversedation/side effects from anesthesia. The patient agrees and gives consent to proceed.  Please refer to procedure notes for findings, recommendations and patient disposition/instructions.     Shane Darling, MD [facility]

## 2024-05-25 ENCOUNTER — Encounter: Payer: Self-pay | Admitting: Gastroenterology

## 2024-05-25 LAB — SURGICAL PATHOLOGY
